# Patient Record
Sex: Male | Born: 1955 | Race: White | Hispanic: No | Marital: Married | Smoking: Former smoker
Health system: Southern US, Community
[De-identification: ages and names within clinical notes are randomized; demographics above are authoritative.]

---

## 2019-12-21 ENCOUNTER — Other Ambulatory Visit: Payer: Self-pay

## 2019-12-21 ENCOUNTER — Inpatient Hospital Stay (HOSPITAL_COMMUNITY)
Admission: AD | Admit: 2019-12-21 | Discharge: 2020-01-03 | DRG: 177 | Disposition: A | Discharge status: Home Health | Payer: HRSA Program | Source: Other Acute Inpatient Hospital | Attending: Internal Medicine | Admitting: Internal Medicine

## 2019-12-21 ENCOUNTER — Encounter (HOSPITAL_COMMUNITY): Payer: Self-pay | Admitting: Student

## 2019-12-21 DIAGNOSIS — Z9981 Dependence on supplemental oxygen: Secondary | ICD-10-CM | POA: Diagnosis not present

## 2019-12-21 DIAGNOSIS — E875 Hyperkalemia: Secondary | ICD-10-CM | POA: Diagnosis not present

## 2019-12-21 DIAGNOSIS — E1169 Type 2 diabetes mellitus with other specified complication: Secondary | ICD-10-CM | POA: Diagnosis not present

## 2019-12-21 DIAGNOSIS — Z79899 Other long term (current) drug therapy: Secondary | ICD-10-CM | POA: Diagnosis not present

## 2019-12-21 DIAGNOSIS — D751 Secondary polycythemia: Secondary | ICD-10-CM | POA: Diagnosis not present

## 2019-12-21 DIAGNOSIS — R0602 Shortness of breath: Secondary | ICD-10-CM

## 2019-12-21 DIAGNOSIS — J1282 Pneumonia due to coronavirus disease 2019: Secondary | ICD-10-CM | POA: Diagnosis present

## 2019-12-21 DIAGNOSIS — U071 COVID-19: Secondary | ICD-10-CM | POA: Diagnosis present

## 2019-12-21 DIAGNOSIS — E1165 Type 2 diabetes mellitus with hyperglycemia: Secondary | ICD-10-CM | POA: Diagnosis present

## 2019-12-21 DIAGNOSIS — T380X5A Adverse effect of glucocorticoids and synthetic analogues, initial encounter: Secondary | ICD-10-CM | POA: Diagnosis present

## 2019-12-21 DIAGNOSIS — R042 Hemoptysis: Secondary | ICD-10-CM | POA: Diagnosis not present

## 2019-12-21 DIAGNOSIS — E860 Dehydration: Secondary | ICD-10-CM | POA: Diagnosis not present

## 2019-12-21 DIAGNOSIS — R0902 Hypoxemia: Secondary | ICD-10-CM | POA: Diagnosis present

## 2019-12-21 DIAGNOSIS — E871 Hypo-osmolality and hyponatremia: Secondary | ICD-10-CM | POA: Diagnosis not present

## 2019-12-21 DIAGNOSIS — Z87891 Personal history of nicotine dependence: Secondary | ICD-10-CM | POA: Diagnosis not present

## 2019-12-21 DIAGNOSIS — Z7984 Long term (current) use of oral hypoglycemic drugs: Secondary | ICD-10-CM

## 2019-12-21 DIAGNOSIS — J9601 Acute respiratory failure with hypoxia: Secondary | ICD-10-CM

## 2019-12-21 DIAGNOSIS — I10 Essential (primary) hypertension: Secondary | ICD-10-CM | POA: Diagnosis present

## 2019-12-21 DIAGNOSIS — J9690 Respiratory failure, unspecified, unspecified whether with hypoxia or hypercapnia: Secondary | ICD-10-CM

## 2019-12-21 DIAGNOSIS — Z7982 Long term (current) use of aspirin: Secondary | ICD-10-CM | POA: Diagnosis not present

## 2019-12-21 MED ORDER — SODIUM CHLORIDE 0.9 % IV SOLN
100.0000 mg | Freq: Every day | INTRAVENOUS | Status: AC
  Administered 2019-12-22 – 2019-12-25 (×4): 100 mg via INTRAVENOUS
  Filled 2019-12-21 (×4): qty 20

## 2019-12-21 MED ORDER — ASPIRIN 81 MG PO CHEW
81.0000 mg | CHEWABLE_TABLET | Freq: Every day | ORAL | Status: DC
  Administered 2019-12-22 – 2019-12-27 (×6): 81 mg via ORAL
  Filled 2019-12-21 (×6): qty 1

## 2019-12-21 MED ORDER — SODIUM CHLORIDE 0.9 % IV SOLN: 100.0000 mg | Freq: Every day | INTRAVENOUS | Status: DC

## 2019-12-21 MED ORDER — HEPARIN SODIUM (PORCINE) 5000 UNIT/ML IJ SOLN: 5000.0000 [IU] | Freq: Three times a day (TID) | INTRAMUSCULAR | Status: DC

## 2019-12-21 MED ORDER — ENOXAPARIN SODIUM 40 MG/0.4ML ~~LOC~~ SOLN
40.0000 mg | Freq: Two times a day (BID) | SUBCUTANEOUS | Status: DC
  Administered 2019-12-22 – 2019-12-27 (×11): 40 mg via SUBCUTANEOUS
  Filled 2019-12-21 (×11): qty 0.4

## 2019-12-21 MED ORDER — SODIUM CHLORIDE 0.9 % IV SOLN: 200.0000 mg | Freq: Once | INTRAVENOUS | Status: DC

## 2019-12-21 MED ORDER — DEXAMETHASONE SODIUM PHOSPHATE 10 MG/ML IJ SOLN
6.0000 mg | INTRAMUSCULAR | Status: DC
  Administered 2019-12-22 – 2019-12-29 (×8): 6 mg via INTRAVENOUS
  Filled 2019-12-21 (×8): qty 1

## 2019-12-21 MED ORDER — ALBUTEROL SULFATE HFA 108 (90 BASE) MCG/ACT IN AERS
2.0000 | INHALATION_SPRAY | Freq: Four times a day (QID) | RESPIRATORY_TRACT | Status: DC
  Administered 2019-12-22 (×2): 2 via RESPIRATORY_TRACT
  Filled 2019-12-21: qty 6.7

## 2019-12-21 MED ORDER — FUROSEMIDE 20 MG PO TABS
20.0000 mg | ORAL_TABLET | Freq: Every day | ORAL | Status: DC
  Administered 2019-12-22 – 2019-12-27 (×6): 20 mg via ORAL
  Filled 2019-12-21 (×6): qty 1

## 2019-12-21 NOTE — H&P (Signed)
NAME:  Theodore Kim, MRN:  [id number], DOB:  10-Aug-1956, LOS: 0 ADMISSION DATE:  12/21/2019, CONSULTATION DATE:  12/21/19 REFERRING MD:  Duke Salvia, CHIEF COMPLAINT:  Covid-19   Brief History   64 y.o. M transferred from [place] with Covid-19 PNA and requiring HFNC.  History of present illness   64 y.o.M with PMH of HTN and Type 2 DM who began experiencing URI symptoms about a week ago and was diagnosed with Covid-19 3/17.  Over the last several days he began experiencing severe shortness of breath with exertion so presented to [facility] ED.    In the ED, he required high flow nasal cannula and had R sided PNA.  His inflammatory markers were significantly elevated and was given Remdesevir, Dexamethasone and Tocilzumab and transferred to [facility] for critical care.  On arrival, pt is awake and conversation without distress and maintaining oxygen sats of >92% on Venti mask.   He states that he is a "mouth breather" and refuses HFNC.  No recent chest pain or LE edema. Pt denies underlying lung disease, but just quit smoking several months ago  Past Medical History  T2DM, HTN  Significant Hospital Events   3/19 Transfer from [place] hospital to [facility]  Consults:    Procedures:    Significant Diagnostic Tests:  3/19 CXR from [place]>>RLL infiltrate  Micro Data:  3/19 Sars-Cov-2>>positive 3/19 BCx2  Antimicrobials:  Remdesevir 3/19-   Interim history/subjective:  Pt arrived from [place] in stable condition on Venti mask  Objective   There were no vitals taken for this visit.   No flowsheet data found.     No intake or output data in the 24 hours ending 12/21/19 2231 There were no vitals filed for this visit.  General:  Well-nourished M in no acute distress HEENT: MM pink/moist Neuro: awake, alert and oriented and conversational CV: s1s2 rrr, no m/r/g PULM:  Good air movement bilaterally with crackles in the bases, no tachypnea, accessory muscle and tolerating  Venti mask  GI: soft, bsx4 active  Extremities: warm/dry, no edema  Skin: no rashes or lesions   Resolved Hospital Problem list     Assessment & Plan:   Covid-19 PNA -Pt refuses HFNC and wants to keep the face mask -currently moving air well without distress P: -try CPAP overnight since he is refusing HFNC -obtain baseline labs and inflammatory markers -Continue Remdesevir and Dexamethasone  -received one dose Tocilizumab, re-evaluate tomorrow if repeat dose indicated  -self-prone as able -hold off anti-biotics for now   HTN -hold home Norvasc and Lisinopril for now in the setting of acute illness -Continue Lasix and Asa    Type 2 DM -hold Metformin -SSI    Best practice:  Diet: regular Pain/Anxiety/Delirium protocol (if indicated): n/a VAP protocol (if indicated): n/a DVT prophylaxis: lovenox GI prophylaxis: n/a Glucose control: SSI Mobility: bed rest Code Status: full code Family Communication: pt able to communicate Disposition:   Labs   CBC: No results for input(s): WBC, NEUTROABS, HGB, HCT, MCV, PLT in the last 168 hours.  Basic Metabolic Panel: No results for input(s): NA, K, CL, CO2, GLUCOSE, BUN, CREATININE, CALCIUM, MG, PHOS in the last 168 hours. GFR: CrCl cannot be calculated (No successful lab value found.). No results for input(s): PROCALCITON, WBC, LATICACIDVEN in the last 168 hours.  Liver Function Tests: No results for input(s): AST, ALT, ALKPHOS, BILITOT, PROT, ALBUMIN in the last 168 hours. No results for input(s): LIPASE, AMYLASE in the last 168 hours. No results for  input(s): AMMONIA in the last 168 hours.  ABG No results found for: PHART, PCO2ART, PO2ART, HCO3, TCO2, ACIDBASEDEF, O2SAT   Coagulation Profile: No results for input(s): INR, PROTIME in the last 168 hours.  Cardiac Enzymes: No results for input(s): CKTOTAL, CKMB, CKMBINDEX, TROPONINI in the last 168 hours.  HbA1C: No results found for: HGBA1C  CBG: No results  for input(s): GLUCAP in the last 168 hours.  Review of Systems:   Negative except as noted in HPI  Past Medical History  DM, HTN  Surgical History     Social History      Family History   His family history is not on file.   Allergies Not on File   Home Medications  Prior to Admission medications   Medication Sig Start Date End Date Taking? Authorizing Provider  amLODipine (NORVASC) 5 MG tablet Take 5 mg by mouth daily.   Yes [provider]  aspirin 81 MG chewable tablet Chew 81 mg by mouth daily.   Yes [provider]  furosemide (LASIX) 20 MG tablet Take 20 mg by mouth daily.   Yes [provider]  lisinopril (ZESTRIL) 20 MG tablet Take 20 mg by mouth daily.   Yes [provider]  metFORMIN (GLUCOPHAGE) 1000 MG tablet Take 1,000 mg by mouth 2 (two) times daily with a meal.   Yes [provider]  metoprolol tartrate (LOPRESSOR) 50 MG tablet Take 50 mg by mouth 2 (two) times daily.   Yes [provider]     Critical care time: 45 minutes      CRITICAL CARE Performed by: Otilio Carpen Miyana Mordecai   Total critical care time: 45 minutes  Critical care time was exclusive of separately billable procedures and treating other patients.  Critical care was necessary to treat or prevent imminent or life-threatening deterioration.  Critical care was time spent personally by me on the following activities: development of treatment plan with patient and/or surrogate as well as nursing, discussions with consultants, evaluation of patient's response to treatment, examination of patient, obtaining history from patient or surrogate, ordering and performing treatments and interventions, ordering and review of laboratory studies, ordering and review of radiographic studies, pulse oximetry and re-evaluation of patient's condition.   Otilio Carpen Jemell Town, PA-C

## 2019-12-21 NOTE — Progress Notes (Signed)
Pt transferred from [facility] - spoke briefly with pharmacist  Pt received: Remdesivir 200mg  IV 3/19 1502 Actemra 800mg  3/19 1623 Dexamethasone 6 mg 3/19 1404 Lovenox 40mg  3/19 1502  Plan: Remdesivir 100mg  daily x 4 days to complete 5 day treatment  4/19, PharmD, BCPS Please see amion for complete clinical pharmacist phone list 12/21/2019 10:44 PM

## 2019-12-22 ENCOUNTER — Inpatient Hospital Stay (HOSPITAL_COMMUNITY): Payer: HRSA Program

## 2019-12-22 DIAGNOSIS — R0602 Shortness of breath: Secondary | ICD-10-CM

## 2019-12-22 LAB — BRAIN NATRIURETIC PEPTIDE: B Natriuretic Peptide: 67.1 pg/mL (ref 0.0–100.0)

## 2019-12-22 LAB — GLUCOSE, CAPILLARY
Glucose-Capillary: 327 mg/dL — ABNORMAL HIGH (ref 70–99)
Glucose-Capillary: 210 mg/dL — ABNORMAL HIGH (ref 70–99)
Glucose-Capillary: 223 mg/dL — ABNORMAL HIGH (ref 70–99)

## 2019-12-22 LAB — FIBRINOGEN: Fibrinogen: 772 mg/dL — ABNORMAL HIGH (ref 210–475)

## 2019-12-22 LAB — ABO/RH: ABO/RH(D): A POS

## 2019-12-22 LAB — CBC
HCT: 42.7 % (ref 39.0–52.0)
Hemoglobin: 14.1 g/dL (ref 13.0–17.0)
MCH: 30.1 pg (ref 26.0–34.0)
MCHC: 33 g/dL (ref 30.0–36.0)
MCV: 91 fL (ref 80.0–100.0)
Platelets: 289 10*3/uL (ref 150–400)
RBC: 4.69 MIL/uL (ref 4.22–5.81)
RDW: 12.5 % (ref 11.5–15.5)
WBC: 6.9 10*3/uL (ref 4.0–10.5)
nRBC: 0 % (ref 0.0–0.2)

## 2019-12-22 LAB — POCT I-STAT 7, (LYTES, BLD GAS, ICA,H+H)
Acid-base deficit: 2 mmol/L (ref 0.0–2.0)
Bicarbonate: 22.3 mmol/L (ref 20.0–28.0)
Calcium, Ion: 1.2 mmol/L (ref 1.15–1.40)
HCT: 40 % (ref 39.0–52.0)
Hemoglobin: 13.6 g/dL (ref 13.0–17.0)
O2 Saturation: 94 %
Patient temperature: 97.8
Potassium: 4.6 mmol/L (ref 3.5–5.1)
Sodium: 133 mmol/L — ABNORMAL LOW (ref 135–145)
TCO2: 23 mmol/L (ref 22–32)
pCO2 arterial: 34.8 mmHg (ref 32.0–48.0)
pH, Arterial: 7.413 (ref 7.350–7.450)
pO2, Arterial: 67 mmHg — ABNORMAL LOW (ref 83.0–108.0)

## 2019-12-22 LAB — COMPREHENSIVE METABOLIC PANEL
ALT: 39 U/L (ref 0–44)
AST: 37 U/L (ref 15–41)
Albumin: 2.2 g/dL — ABNORMAL LOW (ref 3.5–5.0)
Alkaline Phosphatase: 133 U/L — ABNORMAL HIGH (ref 38–126)
Anion gap: 10 (ref 5–15)
BUN: 32 mg/dL — ABNORMAL HIGH (ref 8–23)
CO2: 21 mmol/L — ABNORMAL LOW (ref 22–32)
Calcium: 8.3 mg/dL — ABNORMAL LOW (ref 8.9–10.3)
Chloride: 101 mmol/L (ref 98–111)
Creatinine, Ser: 1.28 mg/dL — ABNORMAL HIGH (ref 0.61–1.24)
GFR calc Af Amer: >60 mL/min (ref >60)
GFR calc non Af Amer: 59 mL/min — ABNORMAL LOW (ref >60)
Glucose, Bld: 373 mg/dL — ABNORMAL HIGH (ref 70–99)
Potassium: 4.9 mmol/L (ref 3.5–5.1)
Sodium: 132 mmol/L — ABNORMAL LOW (ref 135–145)
Total Bilirubin: 1.5 mg/dL — ABNORMAL HIGH (ref 0.3–1.2)
Total Protein: 7.3 g/dL (ref 6.5–8.1)

## 2019-12-22 LAB — FERRITIN: Ferritin: 1421 ng/mL — ABNORMAL HIGH (ref 24–336)

## 2019-12-22 LAB — MAGNESIUM: Magnesium: 2 mg/dL (ref 1.7–2.4)

## 2019-12-22 LAB — LACTATE DEHYDROGENASE: LDH: 604 U/L — ABNORMAL HIGH (ref 98–192)

## 2019-12-22 LAB — HEMOGLOBIN A1C
Hgb A1c MFr Bld: 8.3 % — ABNORMAL HIGH (ref 4.8–5.6)
Mean Plasma Glucose: 191.51 mg/dL

## 2019-12-22 LAB — PHOSPHORUS: Phosphorus: 3.3 mg/dL (ref 2.5–4.6)

## 2019-12-22 LAB — C-REACTIVE PROTEIN: CRP: 25.6 mg/dL — ABNORMAL HIGH (ref <1.0)

## 2019-12-22 LAB — D-DIMER, QUANTITATIVE: D-Dimer, Quant: 2.36 ug/mL-FEU — ABNORMAL HIGH (ref 0.00–0.50)

## 2019-12-22 LAB — HIV ANTIBODY (ROUTINE TESTING W REFLEX): HIV Screen 4th Generation wRfx: NONREACTIVE

## 2019-12-22 MED ORDER — INSULIN ASPART 100 UNIT/ML ~~LOC~~ SOLN
0.0000 [IU] | Freq: Every day | SUBCUTANEOUS | Status: DC
  Administered 2019-12-22: 21:00:00 2 [IU] via SUBCUTANEOUS
  Administered 2019-12-23: 23:00:00 3 [IU] via SUBCUTANEOUS

## 2019-12-22 MED ORDER — ACETAMINOPHEN 325 MG PO TABS: 650.0000 mg | ORAL_TABLET | Freq: Four times a day (QID) | ORAL | Status: DC | PRN

## 2019-12-22 MED ORDER — ALBUTEROL SULFATE HFA 108 (90 BASE) MCG/ACT IN AERS
2.0000 | INHALATION_SPRAY | RESPIRATORY_TRACT | Status: DC | PRN
  Administered 2019-12-24 – 2019-12-30 (×3): 2 via RESPIRATORY_TRACT
  Filled 2019-12-22: qty 6.7

## 2019-12-22 MED ORDER — PERFLUTREN LIPID MICROSPHERE
1.0000 mL | INTRAVENOUS | Status: AC | PRN
  Administered 2019-12-22: 2 mL via INTRAVENOUS
  Filled 2019-12-22: qty 10

## 2019-12-22 MED ORDER — INSULIN ASPART 100 UNIT/ML ~~LOC~~ SOLN
0.0000 [IU] | Freq: Three times a day (TID) | SUBCUTANEOUS | Status: DC
  Administered 2019-12-22: 7 [IU] via SUBCUTANEOUS
  Administered 2019-12-22: 15 [IU] via SUBCUTANEOUS
  Administered 2019-12-23: 17:00:00 3 [IU] via SUBCUTANEOUS
  Administered 2019-12-23: 12:00:00 7 [IU] via SUBCUTANEOUS
  Administered 2019-12-23: 08:00:00 11 [IU] via SUBCUTANEOUS
  Administered 2019-12-24: 09:00:00 7 [IU] via SUBCUTANEOUS
  Administered 2019-12-24 – 2019-12-25 (×3): 4 [IU] via SUBCUTANEOUS
  Administered 2019-12-25: 12:00:00 3 [IU] via SUBCUTANEOUS
  Administered 2019-12-25 – 2019-12-27 (×7): 4 [IU] via SUBCUTANEOUS
  Administered 2019-12-28: 18:00:00 3 [IU] via SUBCUTANEOUS
  Administered 2019-12-28: 13:00:00 7 [IU] via SUBCUTANEOUS
  Administered 2019-12-28: 09:00:00 4 [IU] via SUBCUTANEOUS
  Administered 2019-12-29: 18:00:00 3 [IU] via SUBCUTANEOUS
  Administered 2019-12-29 (×2): 4 [IU] via SUBCUTANEOUS
  Administered 2019-12-30: 17:00:00 11 [IU] via SUBCUTANEOUS
  Administered 2019-12-30: 09:00:00 7 [IU] via SUBCUTANEOUS
  Administered 2019-12-30: 13:00:00 11 [IU] via SUBCUTANEOUS
  Administered 2019-12-31: 13:00:00 4 [IU] via SUBCUTANEOUS
  Administered 2019-12-31: 17:00:00 11 [IU] via SUBCUTANEOUS
  Administered 2019-12-31: 09:00:00 4 [IU] via SUBCUTANEOUS
  Administered 2020-01-01: 08:00:00 3 [IU] via SUBCUTANEOUS
  Administered 2020-01-01: 7 [IU] via SUBCUTANEOUS
  Administered 2020-01-02: 17:00:00 4 [IU] via SUBCUTANEOUS
  Administered 2020-01-02: 08:00:00 3 [IU] via SUBCUTANEOUS
  Administered 2020-01-02: 12:00:00 4 [IU] via SUBCUTANEOUS

## 2019-12-22 MED ORDER — CHLORHEXIDINE GLUCONATE CLOTH 2 % EX PADS
6.0000 | MEDICATED_PAD | Freq: Every day | CUTANEOUS | Status: DC
  Administered 2019-12-22: 21:00:00 6 via TOPICAL

## 2019-12-22 NOTE — Plan of Care (Signed)
  Problem: Education: Goal: Knowledge of General Education information will improve Description: Including pain rating scale, medication(s)/side effects and non-pharmacologic comfort measures Outcome: Progressing   Problem: Activity: Goal: Risk for activity intolerance will decrease Outcome: Progressing   Problem: Nutrition: Goal: Adequate nutrition will be maintained Outcome: Progressing   Problem: Coping: Goal: Level of anxiety will decrease Outcome: Progressing   Problem: Pain Managment: Goal: General experience of comfort will improve Outcome: Progressing   Problem: Safety: Goal: Ability to remain free from injury will improve Outcome: Progressing   Problem: Respiratory: Goal: Will maintain a patent airway Outcome: Progressing

## 2019-12-22 NOTE — Progress Notes (Signed)
Pt's wife called twice today for updates. As the pt is a new admission, we discussed lack of visitation, video visits, and agreed that she would act as the point of contact for the pt.  This was reinforced with pt's granddaughter who called later to set up a video visit; she expressed understanding that the pt's wife should be the only person calling the unit directly, and any other persons should contact the pt or his wife directly.  Pt's wife also had questions about today's chest xray, course of treatment, and the course of covid PNA. These were all discussed in detail, and she expressed understanding.   Pt is very pleasant and cooperative, but was hesitant to get up to the chair today, despite encouragement. I will continue to encourage bed-to-chair and sit-to-stand mobility.   As he struggled managing NRB mask while eating, we attempted the HFNC for dinner. While the cannula allowed him to eat more comfortably, he was unable to maintain spO2>85%, so the NRB was replaced after eating.

## 2019-12-22 NOTE — Progress Notes (Addendum)
  Echocardiogram 2D Echocardiogram with contrast has been performed.  Theodore Kim F 12/22/2019, 5:53 PM

## 2019-12-22 NOTE — Progress Notes (Signed)
NAME:  Theodore Kim, MRN:  [id number], DOB:  [birth year], LOS: 1 ADMISSION DATE:  12/21/2019, CONSULTATION DATE:  12/22/19 REFERRING MD:  Oval Linsey, CHIEF COMPLAINT: Short of breath  Brief History   64 yo male had cough, fever, short of breath x one week.  Found to have COVID 19 on 3/17.  Developed worsening dyspnea and presented to [place] on 3/19.  Found to have COVID 19 pneumonia started on remdesivir, dexamethasone, and tocilizumab.  Transferred to [facility] for further management.  Past Medical History  HTN, DM type II  Significant Hospital Events   3/19 Transfer from [place] [facility] to [facility]  Consults:    Procedures:    Significant Diagnostic Tests:    Micro Data:  3/19 Sars-Cov-2 >> positive 3/19 Blood >>   Antimicrobials:  Tocilizumab 3/19 [facility] hospital) Dexamethasone 3/19 >> Remdesivir 3/19 >>   Interim history/subjective:  Remains on NRB.  Objective   Blood pressure 125/85, pulse 78, temperature 97.6 F (36.4 C), temperature source Axillary, resp. rate (!) 24, height 5\' 10"  (1.778 m), weight 96.4 kg, SpO2 90 %.    Vitals with BMI 12/22/2019 12/22/2019 12/22/2019  Height - - -  Weight - - -  BMI - - -  Systolic - 465 035  Diastolic - 85 75  Pulse 78 82 89        Intake/Output Summary (Last 24 hours) at 12/22/2019 1024 Last data filed at 12/22/2019 4656 Gross per 24 hour  Intake --  Output 525 ml  Net -525 ml   Filed Weights   12/21/19 2321 12/22/19 0310  Weight: 96.4 kg 96.4 kg     General - alert Eyes - pupils reactive ENT - no sinus tenderness, no stridor Cardiac - regular rate/rhythm, no murmur Chest - scattered rhonchi Abdomen - soft, non tender, + bowel sounds Extremities - no cyanosis, clubbing, or edema Skin - no rashes Neuro - normal strength, moves extremities, follows commands Psych - normal mood and behavior   Resolved Hospital Problem list     Assessment & Plan:   Acute hypoxic respiratory failure from COVID 19  pneumonia. - received tocilizumab on 3/19 at [facility] - day 2/5 of remdesivir - day 2/10 of dexamethasone - prone positioning as tolerated - bronchial hygiene - f/u CXR intermittently - goal SpO2 85 to 95%  Hx of HTN. - continue ASA, lasix - hold outpt norvasc, lisinopril, lopressor for now  DM type II poorly controlled with steroid induced hyperglycemia. - SSI - hold outpt metformin   Best practice:  Diet: Carb modified, heart healthy DVT prophylaxis: lovenox GI prophylaxis: n/a Mobility: oob to chair Code Status: full code Disposition: ICU  Labs    CMP Latest Ref Rng & Units 12/22/2019 12/21/2019  Glucose 70 - 99 mg/dL - 373(H)  BUN 8 - 23 mg/dL - 32(H)  Creatinine 0.61 - 1.24 mg/dL - 1.28(H)  Sodium 135 - 145 mmol/L 133(L) 132(L)  Potassium 3.5 - 5.1 mmol/L 4.6 4.9  Chloride 98 - 111 mmol/L - 101  CO2 22 - 32 mmol/L - 21(L)  Calcium 8.9 - 10.3 mg/dL - 8.3(L)  Total Protein 6.5 - 8.1 g/dL - 7.3  Total Bilirubin 0.3 - 1.2 mg/dL - 1.5(H)  Alkaline Phos 38 - 126 U/L - 133(H)  AST 15 - 41 U/L - 37  ALT 0 - 44 U/L - 39    CBC Latest Ref Rng & Units 12/22/2019 12/21/2019  WBC 4.0 - 10.5 K/uL - 6.9  Hemoglobin 13.0 - 17.0 g/dL  13.6 14.1  Hematocrit 39.0 - 52.0 % 40.0 42.7  Platelets 150 - 400 K/uL - 289    ABG    Component Value Date/Time   PHART 7.413 12/22/2019 0358   PCO2ART 34.8 12/22/2019 0358   PO2ART 67.0 (L) 12/22/2019 0358   HCO3 22.3 12/22/2019 0358   TCO2 23 12/22/2019 0358   ACIDBASEDEF 2.0 12/22/2019 0358   O2SAT 94.0 12/22/2019 0358    CBG (last 3)  No results for input(s): GLUCAP in the last 72 hours.   CC time 33 minutes  Coralyn Helling, MD [facility] Pulmonary/Critical Care 12/22/2019, 10:31 AM

## 2019-12-22 NOTE — Progress Notes (Signed)
eLink Physician-Brief Progress Note Patient Name: Theodore Kim DOB: 12/10/1955 MRN: [id number]   Date of Service  12/22/2019  HPI/Events of Note  Pt transferred from outside hospital with acute respiratory failure from Covid-19 pneumonia  eICU Interventions  New Patient Evaluation completed.        [name] U Lenard Kampf 12/22/2019, 1:32 AM

## 2019-12-23 ENCOUNTER — Inpatient Hospital Stay (HOSPITAL_COMMUNITY): Payer: HRSA Program

## 2019-12-23 DIAGNOSIS — U071 COVID-19: Principal | ICD-10-CM

## 2019-12-23 DIAGNOSIS — J1282 Pneumonia due to coronavirus disease 2019: Secondary | ICD-10-CM

## 2019-12-23 LAB — CBC
HCT: 43.7 % (ref 39.0–52.0)
Hemoglobin: 14.8 g/dL (ref 13.0–17.0)
MCH: 30.5 pg (ref 26.0–34.0)
MCHC: 33.9 g/dL (ref 30.0–36.0)
MCV: 89.9 fL (ref 80.0–100.0)
Platelets: 507 10*3/uL — ABNORMAL HIGH (ref 150–400)
RBC: 4.86 MIL/uL (ref 4.22–5.81)
RDW: 12.3 % (ref 11.5–15.5)
WBC: 14 10*3/uL — ABNORMAL HIGH (ref 4.0–10.5)
nRBC: 0.3 % — ABNORMAL HIGH (ref 0.0–0.2)

## 2019-12-23 LAB — COMPREHENSIVE METABOLIC PANEL
ALT: 38 U/L (ref 0–44)
AST: 28 U/L (ref 15–41)
Albumin: 2.2 g/dL — ABNORMAL LOW (ref 3.5–5.0)
Alkaline Phosphatase: 144 U/L — ABNORMAL HIGH (ref 38–126)
Anion gap: 10 (ref 5–15)
BUN: 33 mg/dL — ABNORMAL HIGH (ref 8–23)
CO2: 22 mmol/L (ref 22–32)
Calcium: 8.7 mg/dL — ABNORMAL LOW (ref 8.9–10.3)
Chloride: 103 mmol/L (ref 98–111)
Creatinine, Ser: 1 mg/dL (ref 0.61–1.24)
GFR calc Af Amer: >60 mL/min (ref >60)
GFR calc non Af Amer: >60 mL/min (ref >60)
Glucose, Bld: 290 mg/dL — ABNORMAL HIGH (ref 70–99)
Potassium: 4.9 mmol/L (ref 3.5–5.1)
Sodium: 135 mmol/L (ref 135–145)
Total Bilirubin: 1.2 mg/dL (ref 0.3–1.2)
Total Protein: 7.1 g/dL (ref 6.5–8.1)

## 2019-12-23 LAB — C-REACTIVE PROTEIN: CRP: 14.3 mg/dL — ABNORMAL HIGH (ref <1.0)

## 2019-12-23 LAB — ECHOCARDIOGRAM COMPLETE
Height: 70 in
Weight: 3400.38 oz

## 2019-12-23 LAB — GLUCOSE, CAPILLARY
Glucose-Capillary: 284 mg/dL — ABNORMAL HIGH (ref 70–99)
Glucose-Capillary: 212 mg/dL — ABNORMAL HIGH (ref 70–99)
Glucose-Capillary: 146 mg/dL — ABNORMAL HIGH (ref 70–99)
Glucose-Capillary: 263 mg/dL — ABNORMAL HIGH (ref 70–99)

## 2019-12-23 MED ORDER — INSULIN GLARGINE 100 UNIT/ML ~~LOC~~ SOLN
10.0000 [IU] | Freq: Every day | SUBCUTANEOUS | Status: DC
  Administered 2019-12-23 – 2019-12-24 (×2): 10 [IU] via SUBCUTANEOUS
  Filled 2019-12-23 (×2): qty 0.1

## 2019-12-23 MED ORDER — ORAL CARE MOUTH RINSE
15.0000 mL | Freq: Two times a day (BID) | OROMUCOSAL | Status: DC
  Administered 2019-12-24 – 2020-01-03 (×17): 15 mL via OROMUCOSAL

## 2019-12-23 MED ORDER — SALINE SPRAY 0.65 % NA SOLN
2.0000 | Freq: Two times a day (BID) | NASAL | Status: DC
  Administered 2019-12-23 – 2020-01-03 (×22): 2 via NASAL
  Filled 2019-12-23: qty 44

## 2019-12-23 MED ORDER — AMLODIPINE BESYLATE 5 MG PO TABS
5.0000 mg | ORAL_TABLET | Freq: Every day | ORAL | Status: DC
  Administered 2019-12-23 – 2019-12-28 (×6): 5 mg via ORAL
  Filled 2019-12-23 (×6): qty 1

## 2019-12-23 MED ORDER — METFORMIN HCL 500 MG PO TABS
1000.0000 mg | ORAL_TABLET | Freq: Two times a day (BID) | ORAL | Status: DC
  Administered 2019-12-23 – 2019-12-26 (×7): 1000 mg via ORAL
  Filled 2019-12-23 (×7): qty 2

## 2019-12-23 NOTE — Progress Notes (Signed)
Patient educated about flutter valve and incentive spirometer. Both at bedside. Patient dyspneic with mild exertion and unable to tolerate being off non-rebreather. Will attempt later.   Lyndal Pulley, RN 12/23/2019 4:47 PM

## 2019-12-23 NOTE — Progress Notes (Signed)
Theodore Kim [id number] Admission Data: 12/23/2019 4:45 PM Attending Provider: Coralyn Helling, MD  PCP:No primary care provider on file.  Theodore Kim is a 64 y.o. male patient transferred from 40M to 5W-10 awake, alert  & orientated  X 3,  Full Code, VSS - Blood pressure (!) 140/93, pulse 87, temperature 97.9 F (36.6 C), temperature source Axillary, resp. rate (!) 33, height 5\' 10"  (1.778 m), weight 88.6 kg, SpO2 90 %.on non-rebreather. Tele # [id number] placed and pt is currently running:normal sinus rhythm.  Pt orientation to unit, room and routine. Information packet given to patient/family.  Admission INP armband ID verified with patient/family, and in place. SR up x 2, fall risk assessment complete with patient and family verbalizing understanding of risks associated with falls. Pt verbalizes an understanding of how to use the call bell and to call for help before getting out of bed.  Skin, clean-dry- intact without evidence of bruising, or skin tears.      Will continue to monitor and assist as needed.  , RN 12/23/2019 4:45 PM

## 2019-12-23 NOTE — Progress Notes (Signed)
Called and updated pt's wife, who was very appreciative of the healthcare team's time and efforts. Updates included home meds added, lab markers trending in a positive direction, stable CXR, and pending tx to PCU.

## 2019-12-23 NOTE — Progress Notes (Signed)
NAME:  Theodore Kim, MRN:  [id number], DOB:  [birth year], LOS: 2 ADMISSION DATE:  12/21/2019, CONSULTATION DATE:  12/23/19 REFERRING MD:  Duke Salvia, CHIEF COMPLAINT: Short of breath  Brief History   64 yo male had cough, fever, short of breath x one week.  Found to have COVID 19 on 3/17.  Developed worsening dyspnea and presented to [place] on 3/19.  Found to have COVID 19 pneumonia started on remdesivir, dexamethasone, and tocilizumab.  Transferred to [facility] for further management.  Past Medical History  HTN, DM type II  Significant Hospital Events   3/19 Transfer from [place] [facility] to [facility] 3/21 Transfer to progressive care  Consults:    Procedures:    Significant Diagnostic Tests:    Micro Data:  3/19 Sars-Cov-2 >> positive 3/19 Blood >>   Antimicrobials:  Tocilizumab 3/19 [facility] hospital) Dexamethasone 3/19 >> Remdesivir 3/19 >>   Interim history/subjective:  Didn't like using [place].  Feels like he is getting more air into his lungs.  Objective   Blood pressure (!) 141/87, pulse 82, temperature 97.8 F (36.6 C), temperature source Axillary, resp. rate (!) 34, height 5\' 10"  (1.778 m), weight 96.4 kg, SpO2 96 %.    Vitals with BMI 12/23/2019 12/23/2019 12/23/2019  Height - - -  Weight - - -  BMI - - -  Systolic 141 134 12/25/2019  Diastolic 87 83 84  Pulse 82 79 75        Intake/Output Summary (Last 24 hours) at 12/23/2019 12/25/2019 Last data filed at 12/23/2019 0500 Gross per 24 hour  Intake 100 ml  Output 1975 ml  Net -1875 ml   Filed Weights   12/21/19 2321 12/22/19 0310  Weight: 96.4 kg 96.4 kg     General - alert Eyes - pupils reactive ENT - no sinus tenderness, no stridor Cardiac - regular rate/rhythm, no murmur Chest - coarse BS b/l, no wheeze Abdomen - soft, non tender, + bowel sounds Extremities - no cyanosis, clubbing, or edema Skin - no rashes Neuro - normal strength, moves extremities, follows commands Psych - normal mood and behavior    Resolved Hospital Problem list     Assessment & Plan:   Acute hypoxic respiratory failure from COVID 19 pneumonia. - received tocilizumab on 3/19 at [facility] - day 3/5 of remdesivir - day 3/10 of dexamethasone - prone positioning as able - bronchial hygiene - unable to tolerate using heated high flow oxygen - f/u CXR intermittently - goal SpO2 85 to 95%  Hx of HTN. - continue ASA, lasix - resume norvasc - hold outpt lisinopril, lopressor for now - f/u Echo  DM type II poorly controlled with steroid induced hyperglycemia. - SSI - resume metformin   Best practice:  Diet: Carb modified, heart healthy DVT prophylaxis: lovenox GI prophylaxis: n/a Mobility: oob to chair Code Status: full code Disposition: progressive care 3/21 >> To [place] 3/22 and PCCM off.  Labs    CMP Latest Ref Rng & Units 12/23/2019 12/22/2019 12/21/2019  Glucose 70 - 99 mg/dL 12/23/2019) - 761(Y)  BUN 8 - 23 mg/dL 073(X) - 10(G)  Creatinine 0.61 - 1.24 mg/dL 26(R - 4.85)  Sodium 135 - 145 mmol/L 135 133(L) 132(L)  Potassium 3.5 - 5.1 mmol/L 4.9 4.6 4.9  Chloride 98 - 111 mmol/L 103 - 101  CO2 22 - 32 mmol/L 22 - 21(L)  Calcium 8.9 - 10.3 mg/dL 4.62(V) - 8.3(L)  Total Protein 6.5 - 8.1 g/dL 7.1 - 7.3  Total Bilirubin 0.3 -  1.2 mg/dL 1.2 - 1.5(H)  Alkaline Phos 38 - 126 U/L 144(H) - 133(H)  AST 15 - 41 U/L 28 - 37  ALT 0 - 44 U/L 38 - 39    CBC Latest Ref Rng & Units 12/23/2019 12/22/2019 12/21/2019  WBC 4.0 - 10.5 K/uL 14.0(H) - 6.9  Hemoglobin 13.0 - 17.0 g/dL 14.8 13.6 14.1  Hematocrit 39.0 - 52.0 % 43.7 40.0 42.7  Platelets 150 - 400 K/uL 507(H) - 289    ABG    Component Value Date/Time   PHART 7.413 12/22/2019 0358   PCO2ART 34.8 12/22/2019 0358   PO2ART 67.0 (L) 12/22/2019 0358   HCO3 22.3 12/22/2019 0358   TCO2 23 12/22/2019 0358   ACIDBASEDEF 2.0 12/22/2019 0358   O2SAT 94.0 12/22/2019 0358    CBG (last 3)  Recent Labs    12/22/19 1613 12/22/19 2053 12/23/19 0737  GLUCAP 210*  223* 284*    CXR (reviewed by me) - no change in b/l ASD  Chesley Mires, MD [facility] Pulmonary/Critical Care 12/23/2019, 8:33 AM

## 2019-12-24 ENCOUNTER — Inpatient Hospital Stay (HOSPITAL_COMMUNITY): Payer: HRSA Program

## 2019-12-24 DIAGNOSIS — E1169 Type 2 diabetes mellitus with other specified complication: Secondary | ICD-10-CM

## 2019-12-24 DIAGNOSIS — I10 Essential (primary) hypertension: Secondary | ICD-10-CM

## 2019-12-24 LAB — BASIC METABOLIC PANEL
Anion gap: 15 (ref 5–15)
BUN: 36 mg/dL — ABNORMAL HIGH (ref 8–23)
CO2: 24 mmol/L (ref 22–32)
Calcium: 9 mg/dL (ref 8.9–10.3)
Chloride: 98 mmol/L (ref 98–111)
Creatinine, Ser: 1.03 mg/dL (ref 0.61–1.24)
GFR calc Af Amer: >60 mL/min (ref >60)
GFR calc non Af Amer: >60 mL/min (ref >60)
Glucose, Bld: 235 mg/dL — ABNORMAL HIGH (ref 70–99)
Potassium: 4.7 mmol/L (ref 3.5–5.1)
Sodium: 137 mmol/L (ref 135–145)

## 2019-12-24 LAB — FERRITIN: Ferritin: 1082 ng/mL — ABNORMAL HIGH (ref 24–336)

## 2019-12-24 LAB — CBC
HCT: 47.5 % (ref 39.0–52.0)
Hemoglobin: 15.6 g/dL (ref 13.0–17.0)
MCH: 30.2 pg (ref 26.0–34.0)
MCHC: 32.8 g/dL (ref 30.0–36.0)
MCV: 91.9 fL (ref 80.0–100.0)
Platelets: 634 10*3/uL — ABNORMAL HIGH (ref 150–400)
RBC: 5.17 MIL/uL (ref 4.22–5.81)
RDW: 12.5 % (ref 11.5–15.5)
WBC: 12.1 10*3/uL — ABNORMAL HIGH (ref 4.0–10.5)
nRBC: 0.2 % (ref 0.0–0.2)

## 2019-12-24 LAB — GLUCOSE, CAPILLARY
Glucose-Capillary: 204 mg/dL — ABNORMAL HIGH (ref 70–99)
Glucose-Capillary: 176 mg/dL — ABNORMAL HIGH (ref 70–99)
Glucose-Capillary: 158 mg/dL — ABNORMAL HIGH (ref 70–99)
Glucose-Capillary: 165 mg/dL — ABNORMAL HIGH (ref 70–99)

## 2019-12-24 LAB — C-REACTIVE PROTEIN: CRP: 5.6 mg/dL — ABNORMAL HIGH (ref <1.0)

## 2019-12-24 LAB — D-DIMER, QUANTITATIVE: D-Dimer, Quant: 2.03 ug/mL-FEU — ABNORMAL HIGH (ref 0.00–0.50)

## 2019-12-24 MED ORDER — INSULIN ASPART 100 UNIT/ML ~~LOC~~ SOLN
4.0000 [IU] | Freq: Three times a day (TID) | SUBCUTANEOUS | Status: AC
  Administered 2019-12-24 – 2019-12-30 (×18): 4 [IU] via SUBCUTANEOUS

## 2019-12-24 MED ORDER — INSULIN GLARGINE 100 UNIT/ML ~~LOC~~ SOLN
14.0000 [IU] | Freq: Every day | SUBCUTANEOUS | Status: DC
  Administered 2019-12-25 – 2020-01-03 (×10): 14 [IU] via SUBCUTANEOUS
  Filled 2019-12-24 (×11): qty 0.14

## 2019-12-24 MED ORDER — LORAZEPAM 2 MG/ML IJ SOLN
1.0000 mg | Freq: Once | INTRAMUSCULAR | Status: AC
  Administered 2019-12-24: 1 mg via INTRAVENOUS
  Filled 2019-12-24: qty 1

## 2019-12-24 NOTE — Progress Notes (Signed)
Patient up from bed in the chair, from  NRB mask to venturi mask at 10L oxygen saturation 93-94% in the chair. Pt educated on deep breathing and  Inceptive spirometry. Pt was not able to tolerate HFNC will attempt to decreased oxygen concentration.

## 2019-12-24 NOTE — Progress Notes (Signed)
Inpatient Diabetes Program Recommendations  AACE/ADA: New Consensus Statement on Inpatient Glycemic Control (2015)  Target Ranges:  Prepandial:   less than 140 mg/dL      Peak postprandial:   less than 180 mg/dL (1-2 hours)      Critically ill patients:  140 - 180 mg/dL   Lab Results  Component Value Date   GLUCAP 204 (H) 12/24/2019   HGBA1C 8.3 (H) 12/21/2019    Review of Glycemic Control Results for Theodore Kim, Theodore Kim (MRN [id number]) as of 12/24/2019 10:28  Ref. Range 12/23/2019 07:37 12/23/2019 11:29 12/23/2019 15:51 12/23/2019 21:18 12/24/2019 08:18  Glucose-Capillary Latest Ref Range: 70 - 99 mg/dL 409 (H) 811 (H) 914 (H) 263 (H) 204 (H)   Diabetes history: DM 2 Outpatient Diabetes medications: Amaryl 2 mg Daily, Metformin 1000 mg bid Current orders for Inpatient glycemic control:  Lantus 10 units Daily Novolog 0-20 units tid + hs Metformin 1000 mg bid  A1c 8.3% on 3/19 Decadron 6 mg Q24 hours BUN/Creat: 36/1.03  Inpatient Diabetes Program Recommendations:    Consider increasing Lantus to 15 units.  Thanks,  Christena Deem RN, MSN, BC-ADM Inpatient Diabetes Coordinator Team Pager [phone] (8a-5p)

## 2019-12-24 NOTE — Progress Notes (Signed)
PROGRESS NOTE                                                                                                                                                                                                             Patient Demographics:    Theodore Kim, is a 64 y.o. male, DOB - January 19, 1956, ZLD:[id number]  Outpatient Primary MD for the patient is No primary care provider on file.   Admit date - 12/21/2019   LOS - 3  No chief complaint on file.      Brief Narrative: Patient is a 64 y.o. male with PMHx of DM-2, HTN who was diagnosed with COVID-19 on 3/17-transferred from [facility] to [facility] ICU for worsening hypoxemia.  He was managed in the ICU-and upon stability transfer to the [facility].  See below for further details.  Significant Events: 3/19>> admit to [facility], ICU for worsening hypoxemia. 3/20>> TTE EF 55-60% 3/22>> transferred to [facility]  COVID-19 medications: Remdesivir: 3/19>> Steroids: 3/19>> Actemra: 3/19 x 1  Microbiology data: 3/19: Blood culture: Negative  DVT prophylaxis: Prophylactic Lovenox  Procedures: None  Consults: PCCM   Subjective:    Theodore Kim today was lying comfortably in bed-he was on 100% NRB-he does not like using nasal cannula as he is a "mouth breather"   Assessment  & Plan :   Acute Hypoxic Resp Failure due to Covid 19 Viral pneumonia: Continues to have severe hypoxia but appears very comfortable.  Continue steroids/remdesivir-follow clinical trajectory and inflammatory markers.  Encourage ambulation/incentive spirometry/flutter valve.  RN instructed to titrate down FiO2 as tolerated  Fever: afebrile  O2 requirements:  SpO2: 97 % O2 Flow Rate (L/min): 15 L/min   COVID-19 Labs: Recent Labs    12/21/19 2352 12/23/19 0359 12/24/19 0309 12/24/19 0735  DDIMER 2.36*  --  2.03*  --   FERRITIN 1,421*  --   --  1,082*  LDH 604*  --   --   --    CRP 25.6* 14.3*  --  5.6*       Component Value Date/Time   BNP 67.1 12/21/2019 2352    No results for input(s): PROCALCITON in the last 168 hours.  No results found for: SARSCOV2NAA   Prone/Incentive Spirometry: encouraged incentive spirometry/flutter use 3-4/hour.  DM-2 (A1c 8.3 on 12/21/2019) with mild hyperglycemia secondary to steroids:  CBGs remain mildly elevated-increase Lantus to 14 units, add 4 units of NovoLog with meals.  Continue SSI and follow/optimize  Recent Labs    12/23/19 1551 12/23/19 2118 12/24/19 0818  GLUCAP 146* 263* 204*   HTN: BP controlled-continue amlodipine  ABG:    Component Value Date/Time   PHART 7.413 12/22/2019 0358   PCO2ART 34.8 12/22/2019 0358   PO2ART 67.0 (L) 12/22/2019 0358   HCO3 22.3 12/22/2019 0358   TCO2 23 12/22/2019 0358   ACIDBASEDEF 2.0 12/22/2019 0358   O2SAT 94.0 12/22/2019 0358    Vent Settings: N/A  Condition - Extremely Guarded  Family Communication  :  Spouse updated over the phone 3/22  Code Status :  Full Code  Diet :  Diet Order            Diet heart healthy/carb modified Room service appropriate? Yes; Fluid consistency: Thin  Diet effective now               Disposition Plan  : Probably home with home health services or SNF when ready for discharge.  Barriers to discharge: Hypoxia requiring O2 supplementation/complete 5 days of IV Remdesivir  Antimicorbials  :    Anti-infectives (From admission, onward)   Start     Dose/Rate Route Frequency Ordered Stop   12/23/19 1000  remdesivir 100 mg in sodium chloride 0.9 % 100 mL IVPB  Status:  Discontinued     100 mg 200 mL/hr over 30 Minutes Intravenous Daily 12/21/19 2231 12/21/19 2246   12/22/19 1000  remdesivir 100 mg in sodium chloride 0.9 % 100 mL IVPB     100 mg 200 mL/hr over 30 Minutes Intravenous Daily 12/21/19 2248 12/26/19 0959   12/22/19 0900  remdesivir 200 mg in sodium chloride 0.9% 250 mL IVPB  Status:  Discontinued     200 mg 580  mL/hr over 30 Minutes Intravenous Once 12/21/19 2231 12/21/19 2246      Inpatient Medications  Scheduled Meds:  amLODipine  5 mg Oral Daily   aspirin  81 mg Oral Daily   dexamethasone (DECADRON) injection  6 mg Intravenous Q24H   enoxaparin (LOVENOX) injection  40 mg Subcutaneous Q12H   furosemide  20 mg Oral Daily   insulin aspart  0-20 Units Subcutaneous TID WC   insulin aspart  0-5 Units Subcutaneous QHS   insulin aspart  4 Units Subcutaneous TID WC   [START ON 12/25/2019] insulin glargine  14 Units Subcutaneous Daily   mouth rinse  15 mL Mouth Rinse BID   metFORMIN  1,000 mg Oral BID WC   sodium chloride  2 spray Each Nare BID   Continuous Infusions:  remdesivir 100 mg in NS 100 mL 100 mg (12/24/19 0934)   PRN Meds:.acetaminophen, albuterol   Time Spent in minutes  25  See all Orders from today for further details   Jeoffrey Massed M.D on 12/24/2019 at 10:36 AM  To page go to [email or web address] - use universal password  [facility] -  Office  [phone]    Objective:   Vitals:   12/24/19 0558 12/24/19 0600 12/24/19 0820 12/24/19 0927  BP: 136/79  115/80 131/84  Pulse: 75  77 85  Resp: (!) 25 (!) 40 (!) 36   Temp: 97.6 F (36.4 C)  (!) 97.3 F (36.3 C)   TempSrc: Axillary  Axillary   SpO2: 97%  97%   Weight:      Height:        Wt Readings from Last  3 Encounters:  12/23/19 88.6 kg     Intake/Output Summary (Last 24 hours) at 12/24/2019 1036 Last data filed at 12/24/2019 1004 Gross per 24 hour  Intake 1050 ml  Output 1325 ml  Net -275 ml     Physical Exam Gen Exam:Alert awake-not in any distress HEENT:atraumatic, normocephalic Chest: B/L clear to auscultation anteriorly-but has bibasilar rales CVS:S1S2 regular Abdomen:soft non tender, non distended Extremities:no edema Neurology: Non focal Skin: no rash   Data Review:    CBC Recent Labs  Lab 12/21/19 2352 12/22/19 0358 12/23/19 0359 12/24/19 0309  WBC 6.9  --  14.0*  12.1*  HGB 14.1 13.6 14.8 15.6  HCT 42.7 40.0 43.7 47.5  PLT 289  --  507* 634*  MCV 91.0  --  89.9 91.9  MCH 30.1  --  30.5 30.2  MCHC 33.0  --  33.9 32.8  RDW 12.5  --  12.3 12.5    Chemistries  Recent Labs  Lab 12/21/19 2352 12/22/19 0358 12/23/19 0359 12/24/19 0309  NA 132* 133* 135 137  K 4.9 4.6 4.9 4.7  CL 101  --  103 98  CO2 21*  --  22 24  GLUCOSE 373*  --  290* 235*  BUN 32*  --  33* 36*  CREATININE 1.28*  --  1.00 1.03  CALCIUM 8.3*  --  8.7* 9.0  MG 2.0  --   --   --   AST 37  --  28  --   ALT 39  --  38  --   ALKPHOS 133*  --  144*  --   BILITOT 1.5*  --  1.2  --    ------------------------------------------------------------------------------------------------------------------ No results for input(s): CHOL, HDL, LDLCALC, TRIG, CHOLHDL, LDLDIRECT in the last 72 hours.  Lab Results  Component Value Date   HGBA1C 8.3 (H) 12/21/2019   ------------------------------------------------------------------------------------------------------------------ No results for input(s): TSH, T4TOTAL, T3FREE, THYROIDAB in the last 72 hours.  Invalid input(s): FREET3 ------------------------------------------------------------------------------------------------------------------ Recent Labs    12/21/19 2352 12/24/19 0735  FERRITIN 1,421* 1,082*    Coagulation profile No results for input(s): INR, PROTIME in the last 168 hours.  Recent Labs    12/21/19 2352 12/24/19 0309  DDIMER 2.36* 2.03*    Cardiac Enzymes No results for input(s): CKMB, TROPONINI, MYOGLOBIN in the last 168 hours.  Invalid input(s): CK ------------------------------------------------------------------------------------------------------------------    Component Value Date/Time   BNP 67.1 12/21/2019 2352    Micro Results Recent Results (from the past 240 hour(s))  Culture, blood (routine x 2)     Status: None (Preliminary result)   Collection Time: 12/21/19 11:18 PM   Specimen:  BLOOD RIGHT HAND  Result Value Ref Range Status   Specimen Description BLOOD RIGHT HAND  Final   Special Requests   Final    BOTTLES DRAWN AEROBIC AND ANAEROBIC Blood Culture adequate volume   Culture   Final    NO GROWTH 1 DAY Performed at [facility], [place] [address], [place], [place] [address]    Report Status PENDING  Incomplete  Culture, blood (routine x 2)     Status: None (Preliminary result)   Collection Time: 12/21/19 11:22 PM   Specimen: BLOOD LEFT HAND  Result Value Ref Range Status   Specimen Description BLOOD LEFT HAND  Final   Special Requests AEROBIC BOTTLE ONLY Blood Culture adequate volume  Final   Culture   Final    NO GROWTH 1 DAY Performed at [facility], [address] [place], [place] [address]    Report Status PENDING  Incomplete    Radiology Reports DG Chest Port 1 View  Result Date: 12/23/2019 CLINICAL DATA:  Respiratory failure.  COVID-19. EXAM: PORTABLE CHEST 1 VIEW COMPARISON:  December 22, 2019 FINDINGS: Bilateral pulmonary infiltrates are stable. The cardiomediastinal silhouette is stable. No pneumothorax. No other acute abnormalities. IMPRESSION: Stable bilateral pulmonary infiltrates consistent with history. Electronically Signed   By: Gerome Samavid  Williams III M.D   On: 12/23/2019 08:06   DG Chest Port 1 View  Result Date: 12/22/2019 CLINICAL DATA:  COVID-19 pneumonia. EXAM: PORTABLE CHEST 1 VIEW COMPARISON:  December 21, 2019. FINDINGS: The heart size and mediastinal contours are within normal limits. No pneumothorax or pleural effusion is noted. Stable right lung opacity is noted consistent with pneumonia. Mild left basilar atelectasis or infiltrate is noted. The visualized skeletal structures are unremarkable. IMPRESSION: Stable right lung opacity consistent with pneumonia. Mild left basilar atelectasis or infiltrate is noted. Electronically Signed   By: Lupita RaiderJames  Green Jr M.D.   On: 12/22/2019 08:46   ECHOCARDIOGRAM COMPLETE  Result Date:  12/23/2019    ECHOCARDIOGRAM REPORT   Patient Name:   Theodore CornsDWARD Redler Date of Exam: 12/22/2019 Medical Rec #:  [id number]    Height:       70.0 in Accession #:    [id number]   Weight:       212.5 lb Date of Birth:  1956-06-10    BSA:          2.142 m Patient Age:    63 years     BP:           131/87 mmHg Patient Gender: M            HR:           83 bpm. Exam Location:  Inpatient Procedure: 2D Echo, Color Doppler, Cardiac Doppler and Intracardiac            Opacification Agent Indications:    R06.02 SOB  History:        Patient has no prior history of Echocardiogram examinations.                 CV19.  Sonographer:    Roosvelt Maserachel Lane RDCS Referring Phys: [id number] [facility] LLC[facility]ATHANIEL M MEIER  Sonographer Comments: Technically difficult study due to poor echo windows. IMPRESSIONS  1. Left ventricular ejection fraction, by estimation, is 55 to 60%. The left ventricle has normal function. The left ventricle has no regional wall motion abnormalities. There is mild left ventricular hypertrophy. Left ventricular diastolic parameters are consistent with Grade I diastolic dysfunction (impaired relaxation).  2. Right ventricular systolic function is normal. The right ventricular size is normal. Tricuspid regurgitation signal is inadequate for assessing PA pressure.  3. The mitral valve is abnormal. No evidence of mitral valve regurgitation. No evidence of mitral stenosis.  4. Due to suboptimal acoustic windows, cannot exclude a small mobile echodensity on the aortic valve with independent motion. Best seen in clip 36. This might represent a calcification with artifact, cannot exclude mobile calcification or vegetation. The aortic valve is abnormal. Aortic valve regurgitation is trivial. No aortic stenosis is present. FINDINGS  Left Ventricle: Left ventricular ejection fraction, by estimation, is 55 to 60%. The left ventricle has normal function. The left ventricle has no regional wall motion abnormalities. Definity contrast agent was given IV to  delineate the left ventricular  endocardial borders. The left ventricular internal cavity size was normal in size.  There is mild left ventricular hypertrophy. Left ventricular diastolic parameters are consistent with Grade I diastolic dysfunction (impaired relaxation). Right Ventricle: The right ventricular size is normal. No increase in right ventricular wall thickness. Right ventricular systolic function is normal. Tricuspid regurgitation signal is inadequate for assessing PA pressure. Left Atrium: Left atrial size was normal in size. Right Atrium: Right atrial size was normal in size. Pericardium: There is no evidence of pericardial effusion. Mitral Valve: The mitral valve is abnormal. Normal mobility of the mitral valve leaflets. Mild mitral annular calcification. No evidence of mitral valve regurgitation. No evidence of mitral valve stenosis. Tricuspid Valve: The tricuspid valve is normal in structure. Tricuspid valve regurgitation is trivial. No evidence of tricuspid stenosis. Aortic Valve: Due to suboptimal acoustic windows, cannot exclude a small mobile echodensity on the aortic valve with independent motion. Best seen in clip 36. This might represent a calcification with artifact, cannot exclude mobile calcification or vegetation. The aortic valve is abnormal. Aortic valve regurgitation is trivial. No aortic stenosis is present. There is mild calcification of the aortic valve. Pulmonic Valve: The pulmonic valve was not well visualized. Pulmonic valve regurgitation is trivial. No evidence of pulmonic stenosis. Aorta: The aortic root is normal in size and structure. Venous: The inferior vena cava was not well visualized. IAS/Shunts: The interatrial septum was not well visualized.  LEFT VENTRICLE PLAX 2D LVIDd:         3.30 cm     Diastology LVIDs:         2.30 cm     LV e' lateral:   12.30 cm/s LV PW:         1.20 cm     LV E/e' lateral: 5.5 LV IVS:        1.20 cm     LV e' medial:    6.42 cm/s                             LV E/e' medial:  10.6  LV Volumes (MOD) LV vol d, MOD A4C: 62.7 ml LV vol s, MOD A4C: 26.9 ml LV SV MOD A4C:     62.7 ml RIGHT VENTRICLE RV Basal diam:  3.50 cm RV S prime:     14.30 cm/s TAPSE (M-mode): 1.9 cm LEFT ATRIUM             Index       RIGHT ATRIUM           Index LA diam:        3.10 cm 1.45 cm/m  RA Area:     18.70 cm LA Vol (A2C):   56.8 ml 26.52 ml/m RA Volume:   46.90 ml  21.90 ml/m LA Vol (A4C):   48.4 ml 22.60 ml/m LA Biplane Vol: 55.3 ml 25.82 ml/m  AORTIC VALVE LVOT Vmax:   77.70 cm/s LVOT Vmean:  49.400 cm/s LVOT VTI:    0.144 m  AORTA Ao Root diam: 2.70 cm MITRAL VALVE MV Area (PHT): 3.08 cm    SHUNTS MV Decel Time: 246 msec    Systemic VTI: 0.14 m MV E velocity: 68.10 cm/s MV A velocity: 79.30 cm/s MV E/A ratio:  0.86 Weston Brass MD Electronically signed by Weston Brass MD Signature Date/Time: 12/23/2019/10:12:24 AM    Final

## 2019-12-25 LAB — COMPREHENSIVE METABOLIC PANEL
ALT: 43 U/L (ref 0–44)
AST: 38 U/L (ref 15–41)
Albumin: 2.8 g/dL — ABNORMAL LOW (ref 3.5–5.0)
Alkaline Phosphatase: 174 U/L — ABNORMAL HIGH (ref 38–126)
Anion gap: 18 — ABNORMAL HIGH (ref 5–15)
BUN: 38 mg/dL — ABNORMAL HIGH (ref 8–23)
CO2: 17 mmol/L — ABNORMAL LOW (ref 22–32)
Calcium: 9.1 mg/dL (ref 8.9–10.3)
Chloride: 101 mmol/L (ref 98–111)
Creatinine, Ser: 1.2 mg/dL (ref 0.61–1.24)
GFR calc Af Amer: >60 mL/min (ref >60)
GFR calc non Af Amer: >60 mL/min (ref >60)
Glucose, Bld: 248 mg/dL — ABNORMAL HIGH (ref 70–99)
Potassium: 4.3 mmol/L (ref 3.5–5.1)
Sodium: 136 mmol/L (ref 135–145)
Total Bilirubin: 1.5 mg/dL — ABNORMAL HIGH (ref 0.3–1.2)
Total Protein: 8.6 g/dL — ABNORMAL HIGH (ref 6.5–8.1)

## 2019-12-25 LAB — C-REACTIVE PROTEIN: CRP: 2.9 mg/dL — ABNORMAL HIGH (ref <1.0)

## 2019-12-25 LAB — FERRITIN: Ferritin: 1195 ng/mL — ABNORMAL HIGH (ref 24–336)

## 2019-12-25 LAB — CBC
HCT: 52.9 % — ABNORMAL HIGH (ref 39.0–52.0)
Hemoglobin: 17.8 g/dL — ABNORMAL HIGH (ref 13.0–17.0)
MCH: 30.5 pg (ref 26.0–34.0)
MCHC: 33.6 g/dL (ref 30.0–36.0)
MCV: 90.7 fL (ref 80.0–100.0)
Platelets: 875 10*3/uL — ABNORMAL HIGH (ref 150–400)
RBC: 5.83 MIL/uL — ABNORMAL HIGH (ref 4.22–5.81)
RDW: 12.6 % (ref 11.5–15.5)
WBC: 17.7 10*3/uL — ABNORMAL HIGH (ref 4.0–10.5)
nRBC: 0.2 % (ref 0.0–0.2)

## 2019-12-25 LAB — GLUCOSE, CAPILLARY
Glucose-Capillary: 177 mg/dL — ABNORMAL HIGH (ref 70–99)
Glucose-Capillary: 146 mg/dL — ABNORMAL HIGH (ref 70–99)
Glucose-Capillary: 174 mg/dL — ABNORMAL HIGH (ref 70–99)
Glucose-Capillary: 191 mg/dL — ABNORMAL HIGH (ref 70–99)
Glucose-Capillary: 184 mg/dL — ABNORMAL HIGH (ref 70–99)

## 2019-12-25 LAB — D-DIMER, QUANTITATIVE: D-Dimer, Quant: 2.81 ug/mL-FEU — ABNORMAL HIGH (ref 0.00–0.50)

## 2019-12-25 LAB — MRSA PCR SCREENING: MRSA by PCR: NEGATIVE

## 2019-12-25 NOTE — Progress Notes (Signed)
 Occupational Therapy Evaluation Patient Details Name: Theodore Kim MRN: [id number] DOB: 1956/09/18 Today's Date: 12/25/2019    History of Present Illness Patient is a 64 y.o. male with PMHx of DM-2, HTN who was diagnosed with COVID-19 on 3/17-transferred from [facility] to [facility] ICU for worsening hypoxemia.  He was managed in the ICU-and upon stability transfer to the [facility].   Clinical Impression   PTA, pt lives with wife at home. Pt was Independent in all ADLs, IADLs, and mobility without AD. Pt was working full-time as a Psychologist, clinical. Presently, pt limited by decreased cardiopulmonary tolerance, requiring Min A for ADL tasks due to quick fatigue. Pt received on 8 L O2, 96% at rest. Pt min guard for sit to stand with and without AD, but demonstrated unsteadiness without AD. Pt min guard for mobility around foot of bed with RW, one LOB, and min guard for stand pivot to bedside. Pt supervision for bed mobility to return to supine.  During exertion, pt desat to 79% on 8 L O2, but recovered quickly (within 1 min). Guided pt in pursed lip breathing, but would benefit from reinforced education during next session. Pt pleasant and motivated to return to PLOF. Currently recommend HHOT and 24 hour assistance pending hopeful progress in functional abilities. If pt does not progress quickly, consider SNF for short-term rehab. Will continue to follow acutely and update recommendations as needed.     Follow Up Recommendations  Home health OT;Supervision/Assistance - 24 hour;SNF(pending improvement in mobility and endurance)    Equipment Recommendations  Other (comment)(TBD)    Recommendations for Other Services       Precautions / Restrictions Precautions Precautions: Fall;Other (comment) Precaution Comments: monitor oxygen saturation Restrictions Weight Bearing Restrictions: No      Mobility Bed Mobility Overal bed mobility: Needs Assistance Bed Mobility: Sit to  Supine       Sit to supine: Supervision   General bed mobility comments: Patient already in recliner chair, reports he has been up for a while.  Transfers Overall transfer level: Needs assistance Equipment used: Rolling walker (2 wheeled) Transfers: Sit to/from Stand Sit to Stand: Min guard Stand pivot transfers: Min guard       General transfer comment: sit<>stand trial 1 without AD, sit<>stand trial 2 with RW    Balance Overall balance assessment: Needs assistance Sitting-balance support: No upper extremity supported;Feet supported Sitting balance-Leahy Scale: Good     Standing balance support: No upper extremity supported Standing balance-Leahy Scale: Fair                             ADL either performed or assessed with clinical judgement   ADL Overall ADL's : Needs assistance/impaired Eating/Feeding: Set up;Sitting   Grooming: Set up;Wash/dry face;Sitting Grooming Details (indicate cue type and reason): cues needed to replace Brush in nose after task  Upper Body Bathing: Minimal assistance;Sitting   Lower Body Bathing: Minimal assistance;Sit to/from stand;Sitting/lateral leans   Upper Body Dressing : Minimal assistance;Sitting Upper Body Dressing Details (indicate cue type and reason): Min A to don hospital gown around back Lower Body Dressing: Minimal assistance;Sit to/from stand   Toilet Transfer: Min guard;Stand-pivot   Toileting- Architect and Hygiene: Minimal assistance;Sit to/from stand       Functional mobility during ADLs: Min guard;Rolling walker General ADL Comments: Due to decreased activity tolerance and desats, pt requires Min A for ADLs.      Vision Baseline Vision/History:  Wears glasses       Perception     Praxis      Pertinent Vitals/Pain Pain Assessment: No/denies pain     Hand Dominance Right   Extremity/Trunk Assessment Upper Extremity Assessment Upper Extremity Assessment: Overall WFL for tasks  assessed   Lower Extremity Assessment Lower Extremity Assessment: Defer to PT evaluation       Communication Communication Communication: No difficulties   Cognition Arousal/Alertness: Awake/alert Behavior During Therapy: WFL for tasks assessed/performed Overall Cognitive Status: Within Functional Limits for tasks assessed                                     General Comments  Patient on 8L HFNC, difficulty with PLB techique.    Exercises Exercises: Other exercises Other Exercises Other Exercises: flutter x 3 Other Exercises: incentive spirometer x 3, max 10105mL   Shoulder Instructions      Home Living Family/patient expects to be discharged to:: Private residence Living Arrangements: Spouse/significant other Available Help at Discharge: Family Type of Home: Mobile home Home Access: Stairs to enter Technical  of Steps: 3 Entrance Stairs-Rails: Can reach both Home Layout: One level     Bathroom Shower/Tub: Occupational psychologist: Standard     Home Equipment: Shower seat - built in          Prior Functioning/Environment Level of Independence: Independent        Comments: Pt was Independent in ADLs, iADLs, mobility without AD. Pt was still working full time as Radio broadcast assistant Problem List: Decreased activity tolerance;Impaired balance (sitting and/or standing);Decreased knowledge of use of DME or AE;Cardiopulmonary status limiting activity      OT Treatment/Interventions: Self-care/ADL training;Therapeutic exercise;Energy conservation;DME and/or AE instruction;Therapeutic activities;Patient/family education    OT Goals(Current goals can be found in the care plan section) Acute Rehab OT Goals Patient Stated Goal: get some rest  OT Goal Formulation: With patient Time For Goal Achievement: 01/08/20 Potential to Achieve Goals: Good ADL Goals Pt Will Perform Grooming: with modified  independence;standing Pt Will Perform Upper Body Bathing: with modified independence;sitting Pt Will Perform Lower Body Bathing: with modified independence;sit to/from stand Pt Will Perform Lower Body Dressing: with modified independence;sitting/lateral leans;sit to/from stand Pt Will Transfer to Toilet: with modified independence;ambulating;regular height toilet Pt Will Perform Toileting - Clothing Manipulation and hygiene: with modified independence;sitting/lateral leans;sit to/from stand Additional ADL Goal #1: Pt will verbalize at least 3 energy conservation strategies to implement during ADLs in order to maximize independence.  OT Frequency: Min 3X/week   Barriers to D/C:            Co-evaluation PT/OT/SLP Co-Evaluation/Treatment: Yes Reason for Co-Treatment: Complexity of the patient's impairments (multi-system involvement);Other (comment)(decreased patient activity tolerance) PT goals addressed during session: Mobility/safety with mobility;Proper use of DME;Balance OT goals addressed during session: ADL's and self-care      AM-PAC OT "6 Clicks" Daily Activity     Outcome Measure Help from another person eating meals?: A Little Help from another person taking care of personal grooming?: A Little Help from another person toileting, which includes using toliet, bedpan, or urinal?: A Little Help from another person bathing (including washing, rinsing, drying)?: A Little Help from another person to put on and taking off regular upper body clothing?: A Little Help from another person to put on and taking off regular lower body  clothing?: A Little 6 Click Score: 18   End of Session Equipment Utilized During Treatment: Gait belt;Rolling walker;Oxygen Nurse Communication: Mobility status;Other (comment)(O2 stats)  Activity Tolerance: Patient tolerated treatment well;Patient limited by fatigue Patient left: in bed;with call bell/phone within reach;with bed alarm set  OT Visit  Diagnosis: Unsteadiness on feet (R26.81);Other abnormalities of gait and mobility (R26.89);Muscle weakness (generalized) (M62.81)                Time: 5913-6859 OT Time Calculation (min): 33 min Charges:  OT General Charges $OT Visit: 1 Visit OT Evaluation $OT Eval Moderate Complexity: 1 Mod  Lorre Munroe, OTR/L  Lorre Munroe 12/25/2019, 1:45 PM

## 2019-12-25 NOTE — Progress Notes (Signed)
   12/24/19 2150  MEWS Score  Pulse Rate 91  ECG Heart Rate 90  Resp (!) 32  SpO2 96 %  O2 Device Venturi Mask  Patient Activity (if Appropriate) In bed (Side lying.)  O2 Flow Rate (L/min) 11 L/min  FiO2 (%) 45 %  MEWS Score  MEWS Temp 0  MEWS Systolic 0  MEWS Pulse 0  MEWS RR 2  MEWS LOC 0  MEWS Score 2  MEWS Score Color Yellow  MEWS Assessment  Is this an acute change? No  Provider Notification  Provider Name/Title M.Katherina Right, MD  Date Provider Notified 12/24/19  Time Provider Notified 2202  Notification Type Page  Notification Reason Other (Comment) (Patient states he feels like he's "smothering",anxious.)  Response See new orders  Date of Provider Response 12/24/19  Time of Provider Response 2251 (NP called this RN.)   Patient in bed, laying on right side. Patient states that he is unable to prone, does not want to get up in the chair since he was up in the chair for a while today. This RN offered to place HFNC on patient for only a few minutes for patient to use IS and flutter valve. This RN educated patient of importance of IS and flutter valve. Patient did not want to do this. Patient repeatedly stated that he needed Oxygen, even after this RN stated he had on O2 and SpO2 95-96% on Venti Mask 10L / 45%. RT unable to come to see patient when called, so instructed this RN to increase to 12L / 50%. CXR done and Ativan given IV per orders. Will continue to monitor.

## 2019-12-25 NOTE — Progress Notes (Signed)
PROGRESS NOTE                                                                                                                                                                                                             Patient Demographics:    Theodore Kim, is a 64 y.o. male, DOB - 03-09-1956, ZOX:[id number]  Outpatient Primary MD for the patient is No primary care provider on file.   Admit date - 12/21/2019   LOS - 4  No chief complaint on file.      Brief Narrative: Patient is a 64 y.o. male with PMHx of DM-2, HTN who was diagnosed with COVID-19 on 3/17-transferred from [facility] to [facility] ICU for worsening hypoxemia.  He was managed in the ICU-and upon stability transfer to the [facility].  See below for further details.  Significant Events: 3/19>> admit to [facility], ICU for worsening hypoxemia. 3/20>> TTE EF 55-60% 3/22>> transferred to [facility]  COVID-19 medications: Remdesivir: 3/19>>3/23 Steroids: 3/19>> Actemra: 3/19 x 1  Microbiology data: 3/19: Blood culture: Negative  DVT prophylaxis: Prophylactic Lovenox  Procedures: None  Consults: PCCM   Subjective:   Feels better-sitting in bedside chair-down to 8 L of HFNC.   Assessment  & Plan :   Acute Hypoxic Resp Failure due to Covid 19 Viral pneumonia: Continues to have severe hypoxemia but improved-titrated down to 8 L of HFNC.  Finish remdesivir on 3/23-continue steroids.  Continued attempts to titrate down FiO2.  Check BNP with a.m. labs  Fever: afebrile  O2 requirements:  SpO2: 96 % O2 Flow Rate (L/min): 8 L/min FiO2 (%): 50 %   COVID-19 Labs: Recent Labs    12/23/19 0359 12/24/19 0309 12/24/19 0735 12/25/19 0209  DDIMER  --  2.03*  --  2.81*  FERRITIN  --   --  1,082* 1,195*  CRP 14.3*  --  5.6* 2.9*       Component Value Date/Time   BNP 67.1 12/21/2019 2352    No results for input(s): PROCALCITON in the last  168 hours.  No results found for: SARSCOV2NAA   Prone/Incentive Spirometry: encouraged incentive spirometry/flutter use 3-4/hour.  DM-2 (A1c 8.3 on 12/21/2019) with mild hyperglycemia secondary to steroids: Stable-continue Lantus 14 units, avoidance of NovoLog with meals and SSI.  Continue to follow and optimize.    Recent Labs  12/25/19 0801 12/25/19 1135 12/25/19 1156  GLUCAP 177* 146* 174*   HTN: BP controlled-continue amlodipine  ABG:    Component Value Date/Time   PHART 7.413 12/22/2019 0358   PCO2ART 34.8 12/22/2019 0358   PO2ART 67.0 (L) 12/22/2019 0358   HCO3 22.3 12/22/2019 0358   TCO2 23 12/22/2019 0358   ACIDBASEDEF 2.0 12/22/2019 0358   O2SAT 94.0 12/22/2019 0358    Vent Settings: N/A  Condition - Guarded  Family Communication  :  Spouse updated over the phone 3/23  Code Status :  Full Code  Diet :  Diet Order            Diet heart healthy/carb modified Room service appropriate? Yes; Fluid consistency: Thin  Diet effective now               Disposition Plan  : Probably home with home health services or SNF when ready for discharge.  Barriers to discharge: Hypoxia requiring O2 supplementation/complete 5 days of IV Remdesivir  Antimicorbials  :    Anti-infectives (From admission, onward)   Start     Dose/Rate Route Frequency Ordered Stop   12/23/19 1000  remdesivir 100 mg in sodium chloride 0.9 % 100 mL IVPB  Status:  Discontinued     100 mg 200 mL/hr over 30 Minutes Intravenous Daily 12/21/19 2231 12/21/19 2246   12/22/19 1000  remdesivir 100 mg in sodium chloride 0.9 % 100 mL IVPB     100 mg 200 mL/hr over 30 Minutes Intravenous Daily 12/21/19 2248 12/25/19 0937   12/22/19 0900  remdesivir 200 mg in sodium chloride 0.9% 250 mL IVPB  Status:  Discontinued     200 mg 580 mL/hr over 30 Minutes Intravenous Once 12/21/19 2231 12/21/19 2246      Inpatient Medications  Scheduled Meds: . amLODipine  5 mg Oral Daily  . aspirin  81 mg Oral  Daily  . dexamethasone (DECADRON) injection  6 mg Intravenous Q24H  . enoxaparin (LOVENOX) injection  40 mg Subcutaneous Q12H  . furosemide  20 mg Oral Daily  . insulin aspart  0-20 Units Subcutaneous TID WC  . insulin aspart  0-5 Units Subcutaneous QHS  . insulin aspart  4 Units Subcutaneous TID WC  . insulin glargine  14 Units Subcutaneous Daily  . mouth rinse  15 mL Mouth Rinse BID  . metFORMIN  1,000 mg Oral BID WC  . sodium chloride  2 spray Each Nare BID   Continuous Infusions:  PRN Meds:.acetaminophen, albuterol   Time Spent in minutes  25  See all Orders from today for further details   Jeoffrey Massed M.D on 12/25/2019 at 1:10 PM  To page go to [email or web address] - use universal password  [facility] -  Office  [phone]    Objective:   Vitals:   12/25/19 0735 12/25/19 0904 12/25/19 0943 12/25/19 1159  BP: 103/77 (!) 126/91 (!) 122/91 132/87  Pulse: 97  86 (!) 102  Resp: (!) 30  (!) 30 (!) 34  Temp: 97.8 F (36.6 C)     TempSrc: Oral     SpO2: 97%  97% 96%  Weight:      Height:        Wt Readings from Last 3 Encounters:  12/25/19 88 kg     Intake/Output Summary (Last 24 hours) at 12/25/2019 1310 Last data filed at 12/25/2019 0819 Gross per 24 hour  Intake 600 ml  Output 915 ml  Net -315 ml  Physical Exam Gen Exam:Alert awake-not in any distress HEENT:atraumatic, normocephalic Chest: B/L clear to auscultation anteriorly CVS:S1S2 regular Abdomen:soft non tender, non distended Extremities:no edema Neurology: Non focal Skin: no rash   Data Review:    CBC Recent Labs  Lab 12/21/19 2352 12/22/19 0358 12/23/19 0359 12/24/19 0309 12/25/19 0209  WBC 6.9  --  14.0* 12.1* 17.7*  HGB 14.1 13.6 14.8 15.6 17.8*  HCT 42.7 40.0 43.7 47.5 52.9*  PLT 289  --  507* 634* 875*  MCV 91.0  --  89.9 91.9 90.7  MCH 30.1  --  30.5 30.2 30.5  MCHC 33.0  --  33.9 32.8 33.6  RDW 12.5  --  12.3 12.5 12.6    Chemistries  Recent Labs  Lab  12/21/19 2352 12/22/19 0358 12/23/19 0359 12/24/19 0309 12/25/19 0209  NA 132* 133* 135 137 136  K 4.9 4.6 4.9 4.7 4.3  CL 101  --  103 98 101  CO2 21*  --  22 24 17*  GLUCOSE 373*  --  290* 235* 248*  BUN 32*  --  33* 36* 38*  CREATININE 1.28*  --  1.00 1.03 1.20  CALCIUM 8.3*  --  8.7* 9.0 9.1  MG 2.0  --   --   --   --   AST 37  --  28  --  38  ALT 39  --  38  --  43  ALKPHOS 133*  --  144*  --  174*  BILITOT 1.5*  --  1.2  --  1.5*   ------------------------------------------------------------------------------------------------------------------ No results for input(s): CHOL, HDL, LDLCALC, TRIG, CHOLHDL, LDLDIRECT in the last 72 hours.  Lab Results  Component Value Date   HGBA1C 8.3 (H) 12/21/2019   ------------------------------------------------------------------------------------------------------------------ No results for input(s): TSH, T4TOTAL, T3FREE, THYROIDAB in the last 72 hours.  Invalid input(s): FREET3 ------------------------------------------------------------------------------------------------------------------ Recent Labs    12/24/19 0735 12/25/19 0209  FERRITIN 1,082* 1,195*    Coagulation profile No results for input(s): INR, PROTIME in the last 168 hours.  Recent Labs    12/24/19 0309 12/25/19 0209  DDIMER 2.03* 2.81*    Cardiac Enzymes No results for input(s): CKMB, TROPONINI, MYOGLOBIN in the last 168 hours.  Invalid input(s): CK ------------------------------------------------------------------------------------------------------------------    Component Value Date/Time   BNP 67.1 12/21/2019 2352    Micro Results Recent Results (from the past 240 hour(s))  Culture, blood (routine x 2)     Status: None (Preliminary result)   Collection Time: 12/21/19 11:18 PM   Specimen: BLOOD RIGHT HAND  Result Value Ref Range Status   Specimen Description BLOOD RIGHT HAND  Final   Special Requests   Final    BOTTLES DRAWN AEROBIC AND  ANAEROBIC Blood Culture adequate volume Performed at [facility], [address] [place], [place] [address]    Culture NO GROWTH 3 DAYS  Final   Report Status PENDING  Incomplete  Culture, blood (routine x 2)     Status: None (Preliminary result)   Collection Time: 12/21/19 11:22 PM   Specimen: BLOOD LEFT HAND  Result Value Ref Range Status   Specimen Description BLOOD LEFT HAND  Final   Special Requests   Final    AEROBIC BOTTLE ONLY Blood Culture adequate volume Performed at [facility], [address] [place], [place] [address]    Culture NO GROWTH 3 DAYS  Final   Report Status PENDING  Incomplete  MRSA PCR Screening     Status: None  Collection Time: 12/25/19  4:26 AM   Specimen: Nasal Mucosa; Nasopharyngeal  Result Value Ref Range Status   MRSA by PCR NEGATIVE NEGATIVE Final    Comment:        The GeneXpert MRSA Assay (FDA approved for NASAL specimens only), is one component of a comprehensive MRSA colonization surveillance program. It is not intended to diagnose MRSA infection nor to guide or monitor treatment for MRSA infections. Performed at [facility], [place] [address], [place], [place] [address]     Radiology Reports DG CHEST PORT 1 VIEW  Result Date: 12/24/2019 CLINICAL DATA:  Shortness of breath EXAM: PORTABLE CHEST 1 VIEW COMPARISON:  12/23/2019 FINDINGS: Chronic interstitial opacities compatible with chronic lung disease/fibrosis. Pulse heart is normal size. No effusions. No acute bony abnormality. IMPRESSION: Interstitial prominence throughout the lungs, likely chronic/fibrosis. Electronically Signed   By: Rolm Baptise M.D.   On: 12/24/2019 23:37   DG Chest Port 1 View  Result Date: 12/23/2019 CLINICAL DATA:  Respiratory failure.  COVID-19. EXAM: PORTABLE CHEST 1 VIEW COMPARISON:  December 22, 2019 FINDINGS: Bilateral pulmonary infiltrates are stable. The cardiomediastinal silhouette is stable. No pneumothorax. No other acute abnormalities.  IMPRESSION: Stable bilateral pulmonary infiltrates consistent with history. Electronically Signed   By: Dorise Bullion III M.D   On: 12/23/2019 08:06   DG Chest Port 1 View  Result Date: 12/22/2019 CLINICAL DATA:  COVID-19 pneumonia. EXAM: PORTABLE CHEST 1 VIEW COMPARISON:  December 21, 2019. FINDINGS: The heart size and mediastinal contours are within normal limits. No pneumothorax or pleural effusion is noted. Stable right lung opacity is noted consistent with pneumonia. Mild left basilar atelectasis or infiltrate is noted. The visualized skeletal structures are unremarkable. IMPRESSION: Stable right lung opacity consistent with pneumonia. Mild left basilar atelectasis or infiltrate is noted. Electronically Signed   By: Marijo Conception M.D.   On: 12/22/2019 08:46   ECHOCARDIOGRAM COMPLETE  Result Date: 12/23/2019    ECHOCARDIOGRAM REPORT   Patient Name:   KOL CONSUEGRA Date of Exam: 12/22/2019 Medical Rec #:  [id number]    Height:       70.0 in Accession #:    [id number]   Weight:       212.5 lb Date of Birth:  May 16, 1956    BSA:          2.142 m Patient Age:    8 years     BP:           131/87 mmHg Patient Gender: M            HR:           83 bpm. Exam Location:  Inpatient Procedure: 2D Echo, Color Doppler, Cardiac Doppler and Intracardiac            Opacification Agent Indications:    R06.02 SOB  History:        Patient has no prior history of Echocardiogram examinations.                 CV19.  Sonographer:    Merrie Roof RDCS Referring Phys: [id number] [facility]  Sonographer Comments: Technically difficult study due to poor echo windows. IMPRESSIONS  1. Left ventricular ejection fraction, by estimation, is 55 to 60%. The left ventricle has normal function. The left ventricle has no regional wall motion abnormalities. There is mild left ventricular hypertrophy. Left ventricular diastolic parameters are consistent with Grade I diastolic dysfunction (impaired relaxation).  2. Right ventricular systolic  function  is normal. The right ventricular size is normal. Tricuspid regurgitation signal is inadequate for assessing PA pressure.  3. The mitral valve is abnormal. No evidence of mitral valve regurgitation. No evidence of mitral stenosis.  4. Due to suboptimal acoustic windows, cannot exclude a small mobile echodensity on the aortic valve with independent motion. Best seen in clip 36. This might represent a calcification with artifact, cannot exclude mobile calcification or vegetation. The aortic valve is abnormal. Aortic valve regurgitation is trivial. No aortic stenosis is present. FINDINGS  Left Ventricle: Left ventricular ejection fraction, by estimation, is 55 to 60%. The left ventricle has normal function. The left ventricle has no regional wall motion abnormalities. Definity contrast agent was given IV to delineate the left ventricular  endocardial borders. The left ventricular internal cavity size was normal in size. There is mild left ventricular hypertrophy. Left ventricular diastolic parameters are consistent with Grade I diastolic dysfunction (impaired relaxation). Right Ventricle: The right ventricular size is normal. No increase in right ventricular wall thickness. Right ventricular systolic function is normal. Tricuspid regurgitation signal is inadequate for assessing PA pressure. Left Atrium: Left atrial size was normal in size. Right Atrium: Right atrial size was normal in size. Pericardium: There is no evidence of pericardial effusion. Mitral Valve: The mitral valve is abnormal. Normal mobility of the mitral valve leaflets. Mild mitral annular calcification. No evidence of mitral valve regurgitation. No evidence of mitral valve stenosis. Tricuspid Valve: The tricuspid valve is normal in structure. Tricuspid valve regurgitation is trivial. No evidence of tricuspid stenosis. Aortic Valve: Due to suboptimal acoustic windows, cannot exclude a small mobile echodensity on the aortic valve with  independent motion. Best seen in clip 36. This might represent a calcification with artifact, cannot exclude mobile calcification or vegetation. The aortic valve is abnormal. Aortic valve regurgitation is trivial. No aortic stenosis is present. There is mild calcification of the aortic valve. Pulmonic Valve: The pulmonic valve was not well visualized. Pulmonic valve regurgitation is trivial. No evidence of pulmonic stenosis. Aorta: The aortic root is normal in size and structure. Venous: The inferior vena cava was not well visualized. IAS/Shunts: The interatrial septum was not well visualized.  LEFT VENTRICLE PLAX 2D LVIDd:         3.30 cm     Diastology LVIDs:         2.30 cm     LV e' lateral:   12.30 cm/s LV PW:         1.20 cm     LV E/e' lateral: 5.5 LV IVS:        1.20 cm     LV e' medial:    6.42 cm/s                            LV E/e' medial:  10.6  LV Volumes (MOD) LV vol d, MOD A4C: 62.7 ml LV vol s, MOD A4C: 26.9 ml LV SV MOD A4C:     62.7 ml RIGHT VENTRICLE RV Basal diam:  3.50 cm RV S prime:     14.30 cm/s TAPSE (M-mode): 1.9 cm LEFT ATRIUM             Index       RIGHT ATRIUM           Index LA diam:        3.10 cm 1.45 cm/m  RA Area:     18.70 cm LA Vol (A2C):  56.8 ml 26.52 ml/m RA Volume:   46.90 ml  21.90 ml/m LA Vol (A4C):   48.4 ml 22.60 ml/m LA Biplane Vol: 55.3 ml 25.82 ml/m  AORTIC VALVE LVOT Vmax:   77.70 cm/s LVOT Vmean:  49.400 cm/s LVOT VTI:    0.144 m  AORTA Ao Root diam: 2.70 cm MITRAL VALVE MV Area (PHT): 3.08 cm    SHUNTS MV Decel Time: 246 msec    Systemic VTI: 0.14 m MV E velocity: 68.10 cm/s MV A velocity: 79.30 cm/s MV E/A ratio:  0.86 Weston Brass MD Electronically signed by Weston Brass MD Signature Date/Time: 12/23/2019/10:12:24 AM    Final

## 2019-12-25 NOTE — Progress Notes (Signed)
   12/25/19 0248  MEWS Score  Pulse Rate (!) 110  ECG Heart Rate (!) 112  Resp (!) 28  SpO2 (!) 79 %  O2 Device Room Air (Patient took off O2 mask again.)  MEWS Score  MEWS Temp 0  MEWS Systolic 0  MEWS Pulse 2  MEWS RR 2  MEWS LOC 0  MEWS Score 4  MEWS Score Color Red  MEWS Assessment  Is this an acute change? No (Patient took off O2 mask.)   Patient has removed O2 Venti mask multiple times while asleep. This RN applied O2 9L HFNC. Patient tolerating well so far. Will continue to monitor.

## 2019-12-25 NOTE — Progress Notes (Signed)
   Vital Signs MEWS/VS Documentation      12/25/2019 1159 12/25/2019 1535 12/25/2019 1600 12/25/2019 1900   MEWS Score:  3  4  3  3    MEWS Score Color:  Yellow  Red  Yellow  Yellow   Resp:  (!) 34  (!) 26  (!) 34  --   Pulse:  (!) 102  (!) 108  95  --   BP:  132/87  116/76  104/76  --   Temp:  --  (!) 96.7 F (35.9 C)  --  --   O2 Device:  --  --  HFNC  --   O2 Flow Rate (L/min):  --  --  8 L/min  --     Patient alert and oriented x4 last MEWS score of 3, due to increased respiration 33, and HR 102. Pt on 8L HFNC oxygen saturation 95%. No complaints of SOB or discomfort at this time.nurse continue to encourage spirometry and flutter during the day.      Theodore Kim 12/25/2019,7:32 PM

## 2019-12-25 NOTE — Progress Notes (Signed)
   12/25/19 0010  MEWS Score  Pulse Rate 97  ECG Heart Rate 97  Resp (!) 35  SpO2 (!) 84 %  O2 Device Room Air (Patient took off Venti mask.)  Patient Activity (if Appropriate) In bed  MEWS Score  MEWS Temp 0  MEWS Systolic 0  MEWS Pulse 0  MEWS RR 2  MEWS LOC 0  MEWS Score 2  MEWS Score Color Yellow  MEWS Assessment  Is this an acute change? No  Provider Notification  Provider Name/Title M. Katherina Right, NP  Notification Type Call  Notification Reason Other (Comment) (NP asking of patient status and notes resp. 30s-40s.)  Response No new orders  Date of Provider Response 12/25/19  Time of Provider Response 0011 (NP called this RN.)   Patient's SpO2 noticed to be 80s. Patient found to have O2 Venti mask off. Patient easily arousable, O2 venti mask re-applied. M. Katherina Right, NP aware. Will continue to monitor.

## 2019-12-25 NOTE — Progress Notes (Signed)
Physical Therapy Evaluation  Patient presents with decreased activity tolerance, desats on 8L HFNC with minimal ambulation around the bed using RW and one assist. He does recover within one minute once back to bed. Patient demonstrates difficulty with PLB technique. Pending length of hospitalization and mobility progression/tolerance with physical therapy services in the hospital setting recommend SNF vs home PT with assist from his wife. She has recovered from COVID quicker than patient per patient report.    12/25/19 1156  PT Visit Information  Last PT Received On 12/25/19  Assistance Needed +1  PT/OT/SLP Co-Evaluation/Treatment Yes  Reason for Co-Treatment Complexity of the patient's impairments (multi-system involvement);Other (comment) (decreased patient activity tolerance)  PT goals addressed during session Mobility/safety with mobility;Proper use of DME;Balance  History of Present Illness 64 year old male admitted 12/21/19, transferred from [facility], to ICU for worsening hypoxemia. Patient was diagnosed with COVID on 12/19/19. PMH: DM2  Precautions  Precautions Fall;Other (comment)  Precaution Comments monitor oxygen saturation  Restrictions  Weight Bearing Restrictions No  Home Living  Family/patient expects to be discharged to: Private residence  Living Arrangements Spouse/significant other  Available Help at Discharge Family  Type of Home Mobile home  Home Access Stairs to enter  Entrance Stairs-Number of Steps 3  Entrance Stairs-Rails Can reach both  Home Layout One level  Education officer, museum seat - built in  Prior Function  Level of Independence Independent  Comments Indepedent PTA, no AD, works full time as Psychologist, clinical  Pain Assessment  Pain Assessment No/denies pain  Cognition  Arousal/Alertness Awake/alert  Behavior During Therapy WFL for tasks assessed/performed  Overall Cognitive  Status Within Functional Limits for tasks assessed  Lower Extremity Assessment  Lower Extremity Assessment  (BLE strength grossly 4+/5 except hip flexion 4/5)  Bed Mobility  Overal bed mobility Needs Assistance  Bed Mobility Sit to Supine  Sit to supine Supervision  General bed mobility comments Patient already in recliner chair, reports he has been up for a while, requesting back to bed at end of session.  Transfers  Overall transfer level Needs assistance  Equipment used Rolling walker (2 wheeled)  Transfers Sit to/from Stand  Sit to Stand Min guard  General transfer comment sit<>stand trial 1 without AD, sit<>stand trial 2 with RW  Ambulation/Gait  Ambulation/Gait assistance Min guard;Min assist  Gait Distance (Feet) 10 Feet  Assistive device Rolling walker (2 wheeled)  Gait Pattern/deviations Decreased step length - right;Decreased step length - left  General Gait Details Cues to stand closer to RW for improved safety and mobility. One instance near LOB requiring minA from PT and patient utilizing stepping response.  (desats to 79% on 8L, recovers in approx 1 minute)  Gait velocity decreased  Stairs  (would not be able to tolerate yet)  Balance  Overall balance assessment Needs assistance  Standing balance support No upper extremity supported  Standing balance-Leahy Scale Fair  General Comments  General comments (skin integrity, edema, etc.) Patient on 8L HFNC, difficulty with PLB techique, desats with ambulation to 79%, recovers to >/=90% within one minute once back to bed   Exercises  Exercises Other exercises  Other Exercises  Other Exercises flutter x 3  Other Exercises incentive spirometer x 3, max  PT - End of Session  Equipment Utilized During Treatment Gait belt;Oxygen  Activity Tolerance Patient limited by fatigue  Patient left in bed;with call bell/phone within reach;with bed alarm set  Nurse  Communication Mobility status;Other (comment) (oxygen  saturation response)  PT Assessment  PT Recommendation/Assessment Patient needs continued PT services  PT Visit Diagnosis Unsteadiness on feet (R26.81);Other abnormalities of gait and mobility (R26.89);Muscle weakness (generalized) (M62.81)  PT Problem List Decreased strength;Decreased activity tolerance;Decreased balance;Decreased mobility;Decreased knowledge of use of DME;Cardiopulmonary status limiting activity  Barriers to Discharge Comments activity tolerance  PT Plan  PT Frequency (ACUTE ONLY) Min 3X/week  PT Treatment/Interventions (ACUTE ONLY) DME instruction;Gait training;Stair training;Functional mobility training;Therapeutic activities;Therapeutic exercise;Balance training;Patient/family education  AM-PAC PT "6 Clicks" Mobility Outcome Measure (Version 2)  Help needed turning from your back to your side while in a flat bed without using bedrails? 4  Help needed moving from lying on your back to sitting on the side of a flat bed without using bedrails? 4  Help needed moving to and from a bed to a chair (including a wheelchair)? 3  Help needed standing up from a chair using your arms (e.g., wheelchair or bedside chair)? 3  Help needed to walk in hospital room? 3  Help needed climbing 3-5 steps with a railing?  2  6 Click Score 19  Consider Recommendation of Discharge To: Home with [facility]  PT Recommendation  Follow Up Recommendations SNF;Home health PT;Supervision/Assistance - 24 hour ((pending mobility progression and tolerance))  PT equipment Rolling walker with 5" wheels;3in1 (PT)  Individuals Consulted  Consulted and Agree with Results and Recommendations Patient  Acute Rehab PT Goals  Time For Goal Achievement 01/07/20  Potential to Achieve Goals Good  PT Time Calculation  PT Start Time (ACUTE ONLY) 1156  PT Stop Time (ACUTE ONLY) 1225  PT Time Calculation (min) (ACUTE ONLY) 29 min  PT General Charges  $$ ACUTE PT VISIT 1 Visit  PT Evaluation  $PT Eval Moderate Complexity 1  Mod  Written Expression  Dominant Hand Right   Birdie Hopes, PT, DPT Acute Rehab [phone] office

## 2019-12-26 LAB — COMPREHENSIVE METABOLIC PANEL
ALT: 38 U/L (ref 0–44)
AST: 27 U/L (ref 15–41)
Albumin: 2.8 g/dL — ABNORMAL LOW (ref 3.5–5.0)
Alkaline Phosphatase: 155 U/L — ABNORMAL HIGH (ref 38–126)
Anion gap: 16 — ABNORMAL HIGH (ref 5–15)
BUN: 40 mg/dL — ABNORMAL HIGH (ref 8–23)
CO2: 18 mmol/L — ABNORMAL LOW (ref 22–32)
Calcium: 9 mg/dL (ref 8.9–10.3)
Chloride: 102 mmol/L (ref 98–111)
Creatinine, Ser: 1.2 mg/dL (ref 0.61–1.24)
GFR calc Af Amer: >60 mL/min (ref >60)
GFR calc non Af Amer: >60 mL/min (ref >60)
Glucose, Bld: 200 mg/dL — ABNORMAL HIGH (ref 70–99)
Potassium: 5.2 mmol/L — ABNORMAL HIGH (ref 3.5–5.1)
Sodium: 136 mmol/L (ref 135–145)
Total Bilirubin: 1.5 mg/dL — ABNORMAL HIGH (ref 0.3–1.2)
Total Protein: 7.7 g/dL (ref 6.5–8.1)

## 2019-12-26 LAB — GLUCOSE, CAPILLARY
Glucose-Capillary: 180 mg/dL — ABNORMAL HIGH (ref 70–99)
Glucose-Capillary: 155 mg/dL — ABNORMAL HIGH (ref 70–99)
Glucose-Capillary: 174 mg/dL — ABNORMAL HIGH (ref 70–99)
Glucose-Capillary: 138 mg/dL — ABNORMAL HIGH (ref 70–99)

## 2019-12-26 LAB — CBC
HCT: 54.1 % — ABNORMAL HIGH (ref 39.0–52.0)
Hemoglobin: 18 g/dL — ABNORMAL HIGH (ref 13.0–17.0)
MCH: 29.9 pg (ref 26.0–34.0)
MCHC: 33.3 g/dL (ref 30.0–36.0)
MCV: 89.7 fL (ref 80.0–100.0)
Platelets: 676 10*3/uL — ABNORMAL HIGH (ref 150–400)
RBC: 6.03 MIL/uL — ABNORMAL HIGH (ref 4.22–5.81)
RDW: 12.6 % (ref 11.5–15.5)
WBC: 11.8 10*3/uL — ABNORMAL HIGH (ref 4.0–10.5)
nRBC: 0 % (ref 0.0–0.2)

## 2019-12-26 LAB — C-REACTIVE PROTEIN: CRP: 1.1 mg/dL — ABNORMAL HIGH (ref <1.0)

## 2019-12-26 LAB — BRAIN NATRIURETIC PEPTIDE: B Natriuretic Peptide: 18.2 pg/mL (ref 0.0–100.0)

## 2019-12-26 LAB — D-DIMER, QUANTITATIVE: D-Dimer, Quant: 2.74 ug/mL-FEU — ABNORMAL HIGH (ref 0.00–0.50)

## 2019-12-26 LAB — FERRITIN: Ferritin: 1109 ng/mL — ABNORMAL HIGH (ref 24–336)

## 2019-12-26 NOTE — Plan of Care (Signed)

## 2019-12-26 NOTE — Progress Notes (Signed)
PROGRESS NOTE                                                                                                                                                                                                             Patient Demographics:    Theodore Kim, is a 64 y.o. male, DOB - 09-12-1956, QJJ:[id number]  Outpatient Primary MD for the patient is No primary care provider on file.   Admit date - 12/21/2019   LOS - 5  No chief complaint on file.      Brief Narrative: Patient is a 64 y.o. male with PMHx of DM-2, HTN who was diagnosed with COVID-19 on 3/17-transferred from [facility] to [facility] ICU for worsening hypoxemia.  He was managed in the ICU-and upon stability transfer to the [facility].  See below for further details.  Significant Events: 3/19>> admit to [facility], ICU for worsening hypoxemia. 3/20>> TTE EF 55-60% 3/22>> transferred to [facility]  COVID-19 medications: Remdesivir: 3/19>>3/23 Steroids: 3/19>> Actemra: 3/19 x 1  Microbiology data: 3/19: Blood culture: Negative  DVT prophylaxis: Prophylactic Lovenox  Procedures: None  Consults: PCCM   Subjective:   No complaints overnight-improving-down to 5 L of oxygen this morning.   Assessment  & Plan :   Acute Hypoxic Resp Failure due to Covid 19 Viral pneumonia: Improving-severely hypoxemic on admission-but over the past few days gradually improving-down to 5 L of HFNC today.  Continue with attempts to titrate down FiO2.   Fever: afebrile  O2 requirements:  SpO2: 91 % O2 Flow Rate (L/min): 5 L/min FiO2 (%): 50 %   COVID-19 Labs: Recent Labs    12/24/19 0309 12/24/19 0735 12/25/19 0209 12/26/19 0314  DDIMER 2.03*  --  2.81* 2.74*  FERRITIN  --  1,082* 1,195* 1,109*  CRP  --  5.6* 2.9* 1.1*       Component Value Date/Time   BNP 18.2 12/26/2019 0314    No results for input(s): PROCALCITON in the last 168  hours.  No results found for: SARSCOV2NAA   Prone/Incentive Spirometry: encouraged incentive spirometry/flutter use 3-4/hour.  DM-2 (A1c 8.3 on 12/21/2019) with mild hyperglycemia secondary to steroids: Stable-continue Lantus 14 units, 4 units of NovoLog with meals and SSI.  Continue to follow and optimize.     Recent Labs    12/25/19 2029 12/26/19 0810 12/26/19  1201  GLUCAP 184* 180* 155*   HTN: BP controlled-continue amlodipine  ABG:    Component Value Date/Time   PHART 7.413 12/22/2019 0358   PCO2ART 34.8 12/22/2019 0358   PO2ART 67.0 (L) 12/22/2019 0358   HCO3 22.3 12/22/2019 0358   TCO2 23 12/22/2019 0358   ACIDBASEDEF 2.0 12/22/2019 0358   O2SAT 94.0 12/22/2019 0358    Vent Settings: N/A  Condition - Guarded  Family Communication  :  Spouse updated over the phone 3/24  Code Status :  Full Code  Diet :  Diet Order            Diet heart healthy/carb modified Room service appropriate? Yes; Fluid consistency: Thin  Diet effective now               Disposition Plan  : Probably home with home health services or SNF when ready for discharge-down to 2-3 L of oxygen consistently  Barriers to discharge: Hypoxia requiring O2 supplementation/complete 5 days of IV Remdesivir  Antimicorbials  :    Anti-infectives (From admission, onward)   Start     Dose/Rate Route Frequency Ordered Stop   12/23/19 1000  remdesivir 100 mg in sodium chloride 0.9 % 100 mL IVPB  Status:  Discontinued     100 mg 200 mL/hr over 30 Minutes Intravenous Daily 12/21/19 2231 12/21/19 2246   12/22/19 1000  remdesivir 100 mg in sodium chloride 0.9 % 100 mL IVPB     100 mg 200 mL/hr over 30 Minutes Intravenous Daily 12/21/19 2248 12/25/19 0937   12/22/19 0900  remdesivir 200 mg in sodium chloride 0.9% 250 mL IVPB  Status:  Discontinued     200 mg 580 mL/hr over 30 Minutes Intravenous Once 12/21/19 2231 12/21/19 2246      Inpatient Medications  Scheduled Meds: . amLODipine  5 mg Oral  Daily  . aspirin  81 mg Oral Daily  . dexamethasone (DECADRON) injection  6 mg Intravenous Q24H  . enoxaparin (LOVENOX) injection  40 mg Subcutaneous Q12H  . furosemide  20 mg Oral Daily  . insulin aspart  0-20 Units Subcutaneous TID WC  . insulin aspart  0-5 Units Subcutaneous QHS  . insulin aspart  4 Units Subcutaneous TID WC  . insulin glargine  14 Units Subcutaneous Daily  . mouth rinse  15 mL Mouth Rinse BID  . metFORMIN  1,000 mg Oral BID WC  . sodium chloride  2 spray Each Nare BID   Continuous Infusions:  PRN Meds:.acetaminophen, albuterol   Time Spent in minutes  25  See all Orders from today for further details   [name] M.D on 12/26/2019 at 12:09 PM  To page go to [email or web address] - use universal password  [facility] -  Office  [phone]    Objective:   Vitals:   12/26/19 0416 12/26/19 0744 12/26/19 0800 12/26/19 0827  BP:   104/78 111/75  Pulse: 79 83 89   Resp: (!) 27 (!) 30 (!) 31   Temp:   97.8 F (36.6 C)   TempSrc:   Oral   SpO2: 95% 95% 91%   Weight:      Height:        Wt Readings from Last 3 Encounters:  12/25/19 88 kg     Intake/Output Summary (Last 24 hours) at 12/26/2019 1209 Last data filed at 12/26/2019 0951 Gross per 24 hour  Intake 240 ml  Output 225 ml  Net 15 ml     Physical Exam  Gen Exam:Alert awake-not in any distress HEENT:atraumatic, normocephalic Chest: B/L clear to auscultation anteriorly CVS:S1S2 regular Abdomen:soft non tender, non distended Extremities:no edema Neurology: Non focal Skin: no rash   Data Review:    CBC Recent Labs  Lab 12/21/19 2352 12/21/19 2352 12/22/19 0358 12/23/19 0359 12/24/19 0309 12/25/19 0209 12/26/19 0314  WBC 6.9  --   --  14.0* 12.1* 17.7* 11.8*  HGB 14.1   < > 13.6 14.8 15.6 17.8* 18.0*  HCT 42.7   < > 40.0 43.7 47.5 52.9* 54.1*  PLT 289  --   --  507* 634* 875* 676*  MCV 91.0  --   --  89.9 91.9 90.7 89.7  MCH 30.1  --   --  30.5 30.2 30.5 29.9  MCHC  33.0  --   --  33.9 32.8 33.6 33.3  RDW 12.5  --   --  12.3 12.5 12.6 12.6   < > = values in this interval not displayed.    Chemistries  Recent Labs  Lab 12/21/19 2352 12/21/19 2352 12/22/19 0358 12/23/19 0359 12/24/19 0309 12/25/19 0209 12/26/19 0314  NA 132*   < > 133* 135 137 136 136  K 4.9   < > 4.6 4.9 4.7 4.3 5.2*  CL 101  --   --  103 98 101 102  CO2 21*  --   --  22 24 17* 18*  GLUCOSE 373*  --   --  290* 235* 248* 200*  BUN 32*  --   --  33* 36* 38* 40*  CREATININE 1.28*  --   --  1.00 1.03 1.20 1.20  CALCIUM 8.3*  --   --  8.7* 9.0 9.1 9.0  MG 2.0  --   --   --   --   --   --   AST 37  --   --  28  --  38 27  ALT 39  --   --  38  --  43 38  ALKPHOS 133*  --   --  144*  --  174* 155*  BILITOT 1.5*  --   --  1.2  --  1.5* 1.5*   < > = values in this interval not displayed.   ------------------------------------------------------------------------------------------------------------------ No results for input(s): CHOL, HDL, LDLCALC, TRIG, CHOLHDL, LDLDIRECT in the last 72 hours.  Lab Results  Component Value Date   HGBA1C 8.3 (H) 12/21/2019   ------------------------------------------------------------------------------------------------------------------ No results for input(s): TSH, T4TOTAL, T3FREE, THYROIDAB in the last 72 hours.  Invalid input(s): FREET3 ------------------------------------------------------------------------------------------------------------------ Recent Labs    12/25/19 0209 12/26/19 0314  FERRITIN 1,195* 1,109*    Coagulation profile No results for input(s): INR, PROTIME in the last 168 hours.  Recent Labs    12/25/19 0209 12/26/19 0314  DDIMER 2.81* 2.74*    Cardiac Enzymes No results for input(s): CKMB, TROPONINI, MYOGLOBIN in the last 168 hours.  Invalid input(s): CK ------------------------------------------------------------------------------------------------------------------    Component Value Date/Time   BNP  18.2 12/26/2019 0314    Micro Results Recent Results (from the past 240 hour(s))  Culture, blood (routine x 2)     Status: None (Preliminary result)   Collection Time: 12/21/19 11:18 PM   Specimen: BLOOD RIGHT HAND  Result Value Ref Range Status   Specimen Description BLOOD RIGHT HAND  Final   Special Requests   Final    BOTTLES DRAWN AEROBIC AND ANAEROBIC Blood Culture adequate volume   Culture   Final    NO GROWTH  4 DAYS Performed at [facility], [address], [place], [place] [address]    Report Status PENDING  Incomplete  Culture, blood (routine x 2)     Status: None (Preliminary result)   Collection Time: 12/21/19 11:22 PM   Specimen: BLOOD LEFT HAND  Result Value Ref Range Status   Specimen Description BLOOD LEFT HAND  Final   Special Requests AEROBIC BOTTLE ONLY Blood Culture adequate volume  Final   Culture   Final    NO GROWTH 4 DAYS Performed at [facility], [address] [place], [place] [address]    Report Status PENDING  Incomplete  MRSA PCR Screening     Status: None   Collection Time: 12/25/19  4:26 AM   Specimen: Nasal Mucosa; Nasopharyngeal  Result Value Ref Range Status   MRSA by PCR NEGATIVE NEGATIVE Final    Comment:        The GeneXpert MRSA Assay (FDA approved for NASAL specimens only), is one component of a comprehensive MRSA colonization surveillance program. It is not intended to diagnose MRSA infection nor to guide or monitor treatment for MRSA infections. Performed at [facility], [address] [place], [place] [address]     Radiology Reports DG CHEST PORT 1 VIEW  Result Date: 12/24/2019 CLINICAL DATA:  Shortness of breath EXAM: PORTABLE CHEST 1 VIEW COMPARISON:  12/23/2019 FINDINGS: Chronic interstitial opacities compatible with chronic lung disease/fibrosis. Pulse heart is normal size. No effusions. No acute bony abnormality. IMPRESSION: Interstitial prominence throughout the lungs, likely chronic/fibrosis.  Electronically Signed   By: Charlett Nose M.D.   On: 12/24/2019 23:37   DG Chest Port 1 View  Result Date: 12/23/2019 CLINICAL DATA:  Respiratory failure.  COVID-19. EXAM: PORTABLE CHEST 1 VIEW COMPARISON:  December 22, 2019 FINDINGS: Bilateral pulmonary infiltrates are stable. The cardiomediastinal silhouette is stable. No pneumothorax. No other acute abnormalities. IMPRESSION: Stable bilateral pulmonary infiltrates consistent with history. Electronically Signed   By: Gerome Sam III M.D   On: 12/23/2019 08:06   DG Chest Port 1 View  Result Date: 12/22/2019 CLINICAL DATA:  COVID-19 pneumonia. EXAM: PORTABLE CHEST 1 VIEW COMPARISON:  December 21, 2019. FINDINGS: The heart size and mediastinal contours are within normal limits. No pneumothorax or pleural effusion is noted. Stable right lung opacity is noted consistent with pneumonia. Mild left basilar atelectasis or infiltrate is noted. The visualized skeletal structures are unremarkable. IMPRESSION: Stable right lung opacity consistent with pneumonia. Mild left basilar atelectasis or infiltrate is noted. Electronically Signed   By: Lupita Raider M.D.   On: 12/22/2019 08:46   ECHOCARDIOGRAM COMPLETE  Result Date: 12/23/2019    ECHOCARDIOGRAM REPORT   Patient Name:   Theodore Kim Date of Exam: 12/22/2019 Medical Rec #:  [id number]    Height:       70.0 in Accession #:    [id number]   Weight:       212.5 lb Date of Birth:  1956-08-15    BSA:          2.142 m Patient Age:    63 years     BP:           131/87 mmHg Patient Gender: M            HR:           83 bpm. Exam Location:  Inpatient Procedure: 2D Echo, Color Doppler, Cardiac Doppler and Intracardiac  Opacification Agent Indications:    R06.02 SOB  History:        Patient has no prior history of Echocardiogram examinations.                 CV19.  Sonographer:    Merrie Roof RDCS Referring Phys: [id number] [facility]  Sonographer Comments: Technically difficult study due to poor echo windows.  IMPRESSIONS  1. Left ventricular ejection fraction, by estimation, is 55 to 60%. The left ventricle has normal function. The left ventricle has no regional wall motion abnormalities. There is mild left ventricular hypertrophy. Left ventricular diastolic parameters are consistent with Grade I diastolic dysfunction (impaired relaxation).  2. Right ventricular systolic function is normal. The right ventricular size is normal. Tricuspid regurgitation signal is inadequate for assessing PA pressure.  3. The mitral valve is abnormal. No evidence of mitral valve regurgitation. No evidence of mitral stenosis.  4. Due to suboptimal acoustic windows, cannot exclude a small mobile echodensity on the aortic valve with independent motion. Best seen in clip 36. This might represent a calcification with artifact, cannot exclude mobile calcification or vegetation. The aortic valve is abnormal. Aortic valve regurgitation is trivial. No aortic stenosis is present. FINDINGS  Left Ventricle: Left ventricular ejection fraction, by estimation, is 55 to 60%. The left ventricle has normal function. The left ventricle has no regional wall motion abnormalities. Definity contrast agent was given IV to delineate the left ventricular  endocardial borders. The left ventricular internal cavity size was normal in size. There is mild left ventricular hypertrophy. Left ventricular diastolic parameters are consistent with Grade I diastolic dysfunction (impaired relaxation). Right Ventricle: The right ventricular size is normal. No increase in right ventricular wall thickness. Right ventricular systolic function is normal. Tricuspid regurgitation signal is inadequate for assessing PA pressure. Left Atrium: Left atrial size was normal in size. Right Atrium: Right atrial size was normal in size. Pericardium: There is no evidence of pericardial effusion. Mitral Valve: The mitral valve is abnormal. Normal mobility of the mitral valve leaflets. Mild mitral  annular calcification. No evidence of mitral valve regurgitation. No evidence of mitral valve stenosis. Tricuspid Valve: The tricuspid valve is normal in structure. Tricuspid valve regurgitation is trivial. No evidence of tricuspid stenosis. Aortic Valve: Due to suboptimal acoustic windows, cannot exclude a small mobile echodensity on the aortic valve with independent motion. Best seen in clip 36. This might represent a calcification with artifact, cannot exclude mobile calcification or vegetation. The aortic valve is abnormal. Aortic valve regurgitation is trivial. No aortic stenosis is present. There is mild calcification of the aortic valve. Pulmonic Valve: The pulmonic valve was not well visualized. Pulmonic valve regurgitation is trivial. No evidence of pulmonic stenosis. Aorta: The aortic root is normal in size and structure. Venous: The inferior vena cava was not well visualized. IAS/Shunts: The interatrial septum was not well visualized.  LEFT VENTRICLE PLAX 2D LVIDd:         3.30 cm     Diastology LVIDs:         2.30 cm     LV e' lateral:   12.30 cm/s LV PW:         1.20 cm     LV E/e' lateral: 5.5 LV IVS:        1.20 cm     LV e' medial:    6.42 cm/s  LV E/e' medial:  10.6  LV Volumes (MOD) LV vol d, MOD A4C: 62.7 ml LV vol s, MOD A4C: 26.9 ml LV SV MOD A4C:     62.7 ml RIGHT VENTRICLE RV Basal diam:  3.50 cm RV S prime:     14.30 cm/s TAPSE (M-mode): 1.9 cm LEFT ATRIUM             Index       RIGHT ATRIUM           Index LA diam:        3.10 cm 1.45 cm/m  RA Area:     18.70 cm LA Vol (A2C):   56.8 ml 26.52 ml/m RA Volume:   46.90 ml  21.90 ml/m LA Vol (A4C):   48.4 ml 22.60 ml/m LA Biplane Vol: 55.3 ml 25.82 ml/m  AORTIC VALVE LVOT Vmax:   77.70 cm/s LVOT Vmean:  49.400 cm/s LVOT VTI:    0.144 m  AORTA Ao Root diam: 2.70 cm MITRAL VALVE MV Area (PHT): 3.08 cm    SHUNTS MV Decel Time: 246 msec    Systemic VTI: 0.14 m MV E velocity: 68.10 cm/s MV A velocity: 79.30 cm/s MV  E/A ratio:  0.86 Weston Brass MD Electronically signed by Weston Brass MD Signature Date/Time: 12/23/2019/10:12:24 AM    Final

## 2019-12-27 ENCOUNTER — Inpatient Hospital Stay (HOSPITAL_COMMUNITY): Payer: HRSA Program

## 2019-12-27 LAB — GLUCOSE, CAPILLARY
Glucose-Capillary: 180 mg/dL — ABNORMAL HIGH (ref 70–99)
Glucose-Capillary: 191 mg/dL — ABNORMAL HIGH (ref 70–99)
Glucose-Capillary: 162 mg/dL — ABNORMAL HIGH (ref 70–99)
Glucose-Capillary: 201 mg/dL — ABNORMAL HIGH (ref 70–99)

## 2019-12-27 LAB — COMPREHENSIVE METABOLIC PANEL
ALT: 36 U/L (ref 0–44)
AST: 27 U/L (ref 15–41)
Albumin: 2.8 g/dL — ABNORMAL LOW (ref 3.5–5.0)
Alkaline Phosphatase: 157 U/L — ABNORMAL HIGH (ref 38–126)
Anion gap: 15 (ref 5–15)
BUN: 45 mg/dL — ABNORMAL HIGH (ref 8–23)
CO2: 21 mmol/L — ABNORMAL LOW (ref 22–32)
Calcium: 9 mg/dL (ref 8.9–10.3)
Chloride: 98 mmol/L (ref 98–111)
Creatinine, Ser: 1.21 mg/dL (ref 0.61–1.24)
GFR calc Af Amer: >60 mL/min (ref >60)
GFR calc non Af Amer: >60 mL/min (ref >60)
Glucose, Bld: 183 mg/dL — ABNORMAL HIGH (ref 70–99)
Potassium: 5 mmol/L (ref 3.5–5.1)
Sodium: 134 mmol/L — ABNORMAL LOW (ref 135–145)
Total Bilirubin: 1.5 mg/dL — ABNORMAL HIGH (ref 0.3–1.2)
Total Protein: 7.4 g/dL (ref 6.5–8.1)

## 2019-12-27 LAB — CBC
HCT: 53.9 % — ABNORMAL HIGH (ref 39.0–52.0)
Hemoglobin: 18.3 g/dL — ABNORMAL HIGH (ref 13.0–17.0)
MCH: 30.4 pg (ref 26.0–34.0)
MCHC: 34 g/dL (ref 30.0–36.0)
MCV: 89.7 fL (ref 80.0–100.0)
Platelets: 742 10*3/uL — ABNORMAL HIGH (ref 150–400)
RBC: 6.01 MIL/uL — ABNORMAL HIGH (ref 4.22–5.81)
RDW: 12.7 % (ref 11.5–15.5)
WBC: 14 10*3/uL — ABNORMAL HIGH (ref 4.0–10.5)
nRBC: 0 % (ref 0.0–0.2)

## 2019-12-27 LAB — CULTURE, BLOOD (ROUTINE X 2)
Culture: NO GROWTH
Culture: NO GROWTH
Special Requests: ADEQUATE
Special Requests: ADEQUATE

## 2019-12-27 LAB — C-REACTIVE PROTEIN: CRP: 0.7 mg/dL (ref <1.0)

## 2019-12-27 LAB — FERRITIN: Ferritin: 981 ng/mL — ABNORMAL HIGH (ref 24–336)

## 2019-12-27 LAB — D-DIMER, QUANTITATIVE: D-Dimer, Quant: 1.74 ug/mL-FEU — ABNORMAL HIGH (ref 0.00–0.50)

## 2019-12-27 MED ORDER — HYDROCOD POLST-CPM POLST ER 10-8 MG/5ML PO SUER
5.0000 mL | Freq: Two times a day (BID) | ORAL | Status: DC | PRN
  Administered 2019-12-28 – 2019-12-29 (×2): 5 mL via ORAL
  Filled 2019-12-27 (×3): qty 5

## 2019-12-27 MED ORDER — ENOXAPARIN SODIUM 40 MG/0.4ML ~~LOC~~ SOLN
40.0000 mg | SUBCUTANEOUS | Status: DC
  Administered 2019-12-29 – 2020-01-03 (×6): 40 mg via SUBCUTANEOUS
  Filled 2019-12-27 (×7): qty 0.4

## 2019-12-27 MED ORDER — BENZONATATE 100 MG PO CAPS
200.0000 mg | ORAL_CAPSULE | Freq: Three times a day (TID) | ORAL | Status: DC | PRN
  Administered 2019-12-27 – 2019-12-29 (×3): 200 mg via ORAL
  Filled 2019-12-27 (×3): qty 2

## 2019-12-27 NOTE — Progress Notes (Signed)
Ambulated with pt in room to bathroom.  At rest spO2 86% on RA.  spO2 decreased to 79% while ambulating on room air.  Pt required 4L O2 for spO2 to return to 90%.

## 2019-12-27 NOTE — TOC Initial Note (Addendum)
Transition of Care [facility]) - Initial/Assessment Note    Patient Details  Name: Theodore Kim MRN: [id number] Date of Birth: [birth year]  [facility] CM/SW Contact:    Cherylann Parr, RN Phone Number: 12/27/2019, 11:38 AM  Clinical Narrative:    CM received request from attending to contact pt's wife regarding discharging home.  Pt was unreachable via phone.  CM contacted wife via phone.  CM read aloud both PT and OT latest assessments along with recommendations.  Wife informed CM that both her and pt prefer for pt to return home with [facility] instead of SNF.   CM explained that [facility] will provide one to two 45 minute sessions during the week - the remainder of the time all needs would fall on wife/pt.  Wife acknowledges limited sessions with HH and would like CM to make charity referral to [facility] agency ([facility]) - referral made.   Home oxygen is ordered  - wife would also like for CM to request DME chairty via [employer] - referral made.  Wife informed that acceptance for charity will be income based - both agencies to reach out to wife.   Pt has a PCP with [facility] in [facility] - Dr Clelia Croft.  Wife informed CM that they pay only $60 each visit - wife denied hardship and she informed CM that they would like to remain with current PCP.  Wife will call PCP today and request a televist appt for next week.  Wife informed CM that she utilizes [facility] for prescription cost.    [facility] accepted for [facility] via charity  [employer] accepted pt for charity   Expected Discharge Plan: Home w Home Health Services Barriers to Discharge: Continued Medical Work up   Patient Goals and CMS Choice     Choice offered to / list presented to : Patient, Spouse  Expected Discharge Plan and Services Expected Discharge Plan: Home w Home Health Services     Post Acute Care Choice: Home Health, Durable Medical Equipment Living arrangements for the past 2 months: Single Family Home                 DME  Arranged: Oxygen, Walker rolling DME Agency: [facility] Date DME Agency Contacted: 12/27/19 Time DME Agency Contacted: 1137 Representative spoke with at DME Agency: Zack HH Arranged: OT, PT HH Agency: [facility] Date HH Agency Contacted: 12/27/19 Time HH Agency Contacted: 1137 Representative spoke with at [facility] Agency: Cassie  Prior Living Arrangements/Services Living arrangements for the past 2 months: Single Family Home Lives with:: Spouse Patient language and need for interpreter reviewed:: Yes Do you feel safe going back to the place where you live?: Yes      Need for Family Participation in Patient Care: Yes (Comment) Care giver support system in place?: Yes (comment)   Criminal Activity/Legal Involvement Pertinent to Current Situation/Hospitalization: No - Comment as needed  Activities of Daily Living Home Assistive Devices/Equipment: Dentures (specify type), Eyeglasses ADL Screening (condition at time of admission) Patient's cognitive ability adequate to safely complete daily activities?: Yes Is the patient deaf or have difficulty hearing?: No Does the patient have difficulty seeing, even when wearing glasses/contacts?: No Does the patient have difficulty concentrating, remembering, or making decisions?: No Patient able to express need for assistance with ADLs?: Yes Does the patient have difficulty dressing or bathing?: No Independently performs ADLs?: Yes (appropriate for developmental age) Does the patient have difficulty walking or climbing stairs?: No Weakness of Legs: None  Weakness of Arms/Hands: None  Permission Sought/Granted   Permission granted to share information with : Yes, Verbal Permission Granted              Emotional Assessment           Psych Involvement: No (comment)  Admission diagnosis:  Pneumonia due to COVID-19 virus [U07.1, J12.82] Patient Active Problem List   Diagnosis Date Noted  . Pneumonia due to COVID-19 virus  12/21/2019  . Acute respiratory failure with hypoxia ([place])    PCP:  No primary care provider on file. Pharmacy:   [id number] [place], [place] - [address] AT [place] [address] [place] [address] Phone: [phone] Fax: [phone]  [facility] [address], [place] - [place] [address] [place] [address] Phone: [phone] Fax: [phone]     Social Determinants of Health (SDOH) Interventions    Readmission Risk Interventions No flowsheet data found.

## 2019-12-27 NOTE — Plan of Care (Signed)

## 2019-12-27 NOTE — Progress Notes (Signed)
Physical Therapy Treatment Note  Patient presents with impaired activity tolerance and desats with mobility despite 4L suppl oxygen. Difficult to always get accurate oxygen saturation reading despite using different probes but patient with DOE and desats to high 70s-low80s% with limited mobility in room, RR in 40s with mobility. Recommend d/c to SNF for continued skilled PT services. If patient decides to discharge home, recommend home PT, 24/7 assist, 4WW, and BSC as patient does not demonstrate the mobility needed to negotiate around his home environment.    12/27/19 1516  PT Visit Information  Last PT Received On 12/27/19  Assistance Needed +1  PT/OT/SLP Co-Evaluation/Treatment Yes  Reason for Co-Treatment Complexity of the patient's impairments (multi-system involvement);Other (comment) (decreased tolerance)  History of Present Illness 64 year old male admitted 12/21/19, transferred from [facility], to ICU for worsening hypoxemia. Patient was diagnosed with COVID on 12/19/19. PMH: DM2  Subjective Data  Subjective "You all are in cahoots with my wife."  Precautions  Precautions Fall;Other (comment)  Precaution Comments monitor oxygen saturation  Restrictions  Weight Bearing Restrictions No  Pain Assessment  Pain Assessment Faces  Faces Pain Scale 2  Pain Location R rib area  Pain Intervention(s) Repositioned;Monitored during session;Limited activity within patient's tolerance  Cognition  Arousal/Alertness Awake/alert  Bed Mobility  Overal bed mobility Modified Independent  General bed mobility comments supine<>sit twice  Transfers  Overall transfer level Needs assistance  Equipment used Rolling walker (2 wheeled)  Transfers Sit to/from Stand  Sit to Stand Min guard;Supervision  General transfer comment Sit<>stand from EOB x 2 trials with cues for hand placement as patient tends to pull on RW. Sit<>stand from standard armed chair.  Ambulation/Gait  Ambulation/Gait  assistance Min guard  Gait Distance (Feet) 10 Feet (x 2)  Assistive device Rolling walker (2 wheeled)  Gait Pattern/deviations Trunk flexed  General Gait Details Cues to stand close to RW and to relax shoulders with use.  Stairs  (patient would not be able to tolerate stairs at this time)  Balance  Overall balance assessment Needs assistance  Sitting-balance support Feet supported;Bilateral upper extremity supported  Sitting balance-Leahy Scale Good  Standing balance support Bilateral upper extremity supported  Standing balance-Leahy Scale Fair  General Comments  General comments (skin integrity, edema, etc.) Patient on 2L HFNC at rest, 4L with mobility. Pulse oximeter not always with accurate reading despite switch from L ear probe to R ear probe and back again. Oxygen saturation down to high70s to low 80s% during mobility, RR in 40s, HR in one teens. Demo and cues for PLB and to slow down breathing as patient tends to mouth breathe.   PT - End of Session  Equipment Utilized During Treatment Gait belt;Oxygen  Activity Tolerance Patient limited by fatigue;Other (comment);Treatment limited secondary to medical complications (Comment) (desats with limited mobility)  Patient left in bed;with call bell/phone within reach;Other (comment) (OT in room tending to patient)  Nurse Communication Other (comment) (PT session)   PT - Assessment/Plan  PT Plan Discharge plan needs to be updated  PT Visit Diagnosis Unsteadiness on feet (R26.81);Other abnormalities of gait and mobility (R26.89);Muscle weakness (generalized) (M62.81)  PT Frequency (ACUTE ONLY) Min 2X/week  Follow Up Recommendations SNF (if patient decides to d/c home recommend 24/7 assist, homePT)  PT equipment 3in1 (PT);Other (comment) (8NO)  AM-PAC PT "6 Clicks" Mobility Outcome Measure (Version 2)  Help needed turning from your back to your side while in a flat bed without using bedrails? 4  Help needed moving from  lying on your  back to sitting on the side of a flat bed without using bedrails? 4  Help needed moving to and from a bed to a chair (including a wheelchair)? 3  Help needed standing up from a chair using your arms (e.g., wheelchair or bedside chair)? 3  Help needed to walk in hospital room? 3  Help needed climbing 3-5 steps with a railing?  1  6 Click Score 18  Consider Recommendation of Discharge To: Home with [facility]  PT Goal Progression  Progress towards PT goals Progressing toward goals  PT Time Calculation  PT Start Time (ACUTE ONLY) 1516  PT Stop Time (ACUTE ONLY) 1549  PT Time Calculation (min) (ACUTE ONLY) 33 min  PT General Charges  $$ ACUTE PT VISIT 1 Visit  PT Treatments  $Gait Training 8-22 mins   Birdie Hopes, PT, DPT Acute Rehab [phone] office

## 2019-12-27 NOTE — Progress Notes (Signed)
PROGRESS NOTE                                                                                                                                                                                                             Patient Demographics:    Theodore Kim, is a 64 y.o. male, DOB - 1956-04-20, GUR:[id number]  Outpatient Primary MD for the patient is No primary care provider on file.   Admit date - 12/21/2019   LOS - 6  No chief complaint on file.      Brief Narrative: Patient is a 64 y.o. male with PMHx of DM-2, HTN who was diagnosed with COVID-19 on 3/17-transferred from [facility] to [facility] ICU for worsening hypoxemia.  He was managed in the [facility] upon stability transfer to the [facility].  See below for further details.  Significant Events: 3/19>> admit to [facility], ICU for worsening hypoxemia. 3/20>> TTE EF 55-60% 3/22>> transferred to [facility]  COVID-19 medications: Remdesivir: 3/19>>3/23 Steroids: 3/19>> Actemra: 3/19 x 1  Microbiology data: 3/19: Blood culture: Negative  DVT prophylaxis: Prophylactic Lovenox  Procedures: None  Consults: PCCM   Subjective:   No major events overnight-feels that he is slowly getting better.  He continues to have some amount of exertional dyspnea but at rest he appears stable.  Oxygen requirements are slowly coming down-down to 2 L of oxygen at rest this morning.   Assessment  & Plan :   Acute Hypoxic Resp Failure due to Covid 19 Viral pneumonia: Continues to slowly improve-on 15 L of HFNC on admission to the ICU-has been slowly titrated down to just 2 L of oxygen at rest this morning.  Looks like he requires around 4 L with ambulation.  Continue steroids-continue to ambulate and see how he does.  If clinical improvement continues-suspect he should be ready to go home in the next 1-2 days.     Fever: afebrile  O2 requirements:  SpO2: 94 % O2 Flow  Rate (L/min): 2 L/min FiO2 (%): 50 %   COVID-19 Labs: Recent Labs    12/25/19 0209 12/26/19 0314 12/27/19 0404  DDIMER 2.81* 2.74* 1.74*  FERRITIN 1,195* 1,109* 981*  CRP 2.9* 1.1* 0.7       Component Value Date/Time   BNP 18.2 12/26/2019 0314    No results for input(s): PROCALCITON in the  last 168 hours.  No results found for: SARSCOV2NAA   Prone/Incentive Spirometry: encouraged incentive spirometry/flutter use 3-4/hour.  DM-2 (A1c 8.3 on 12/21/2019) with mild hyperglycemia secondary to steroids: Stable-continue Lantus 14 units, 4 units of NovoLog with meals and SSI.  Continue to follow and optimize.   We will plan on transitioning him back to oral hypoglycemic agents on discharge as he will likely be on steroids for total of 10 days only.  Recent Labs    12/26/19 1548 12/26/19 2041 12/27/19 0759  GLUCAP 174* 138* 180*   HTN: BP controlled-continue amlodipine  ABG:    Component Value Date/Time   PHART 7.413 12/22/2019 0358   PCO2ART 34.8 12/22/2019 0358   PO2ART 67.0 (L) 12/22/2019 0358   HCO3 22.3 12/22/2019 0358   TCO2 23 12/22/2019 0358   ACIDBASEDEF 2.0 12/22/2019 0358   O2SAT 94.0 12/22/2019 0358    Vent Settings: N/A  Condition - Guarded  Family Communication  :  Spouse updated over the phone 3/25  Code Status :  Full Code  Diet :  Diet Order            Diet heart healthy/carb modified Room service appropriate? Yes; Fluid consistency: Thin  Diet effective now               Disposition Plan  : Probably home with home health services in the next day or so.  Barriers to discharge: Hypoxia requiring O2 supplementation  Antimicorbials  :    Anti-infectives (From admission, onward)   Start     Dose/Rate Route Frequency Ordered Stop   12/23/19 1000  remdesivir 100 mg in sodium chloride 0.9 % 100 mL IVPB  Status:  Discontinued     100 mg 200 mL/hr over 30 Minutes Intravenous Daily 12/21/19 2231 12/21/19 2246   12/22/19 1000  remdesivir 100 mg  in sodium chloride 0.9 % 100 mL IVPB     100 mg 200 mL/hr over 30 Minutes Intravenous Daily 12/21/19 2248 12/25/19 0937   12/22/19 0900  remdesivir 200 mg in sodium chloride 0.9% 250 mL IVPB  Status:  Discontinued     200 mg 580 mL/hr over 30 Minutes Intravenous Once 12/21/19 2231 12/21/19 2246      Inpatient Medications  Scheduled Meds: . amLODipine  5 mg Oral Daily  . aspirin  81 mg Oral Daily  . dexamethasone (DECADRON) injection  6 mg Intravenous Q24H  . enoxaparin (LOVENOX) injection  40 mg Subcutaneous Q12H  . furosemide  20 mg Oral Daily  . insulin aspart  0-20 Units Subcutaneous TID WC  . insulin aspart  0-5 Units Subcutaneous QHS  . insulin aspart  4 Units Subcutaneous TID WC  . insulin glargine  14 Units Subcutaneous Daily  . mouth rinse  15 mL Mouth Rinse BID  . sodium chloride  2 spray Each Nare BID   Continuous Infusions:  PRN Meds:.acetaminophen, albuterol   Time Spent in minutes  25  See all Orders from today for further details   Jeoffrey MassedShanker Margarethe Virgen M.D on 12/27/2019 at 10:45 AM  To page go to [email or web address] - use universal password  [facility] -  Office  [phone]    Objective:   Vitals:   12/27/19 0400 12/27/19 0721 12/27/19 0722 12/27/19 0728  BP: 101/71     Pulse: 79 91 91 84  Resp:  (!) 32  19  Temp: 98.1 F (36.7 C)     TempSrc: Oral     SpO2: 94% (!) 89% 92%  94%  Weight:      Height:        Wt Readings from Last 3 Encounters:  12/25/19 88 kg     Intake/Output Summary (Last 24 hours) at 12/27/2019 1045 Last data filed at 12/27/2019 0643 Gross per 24 hour  Intake 360 ml  Output 700 ml  Net -340 ml     Physical Exam Gen Exam:Alert awake-not in any distress HEENT:atraumatic, normocephalic Chest: B/L clear to auscultation anteriorly CVS:S1S2 regular Abdomen:soft non tender, non distended Extremities:no edema Neurology: Non focal Skin: no rash   Data Review:    CBC Recent Labs  Lab 12/23/19 0359 12/24/19 0309  12/25/19 0209 12/26/19 0314 12/27/19 0404  WBC 14.0* 12.1* 17.7* 11.8* 14.0*  HGB 14.8 15.6 17.8* 18.0* 18.3*  HCT 43.7 47.5 52.9* 54.1* 53.9*  PLT 507* 634* 875* 676* 742*  MCV 89.9 91.9 90.7 89.7 89.7  MCH 30.5 30.2 30.5 29.9 30.4  MCHC 33.9 32.8 33.6 33.3 34.0  RDW 12.3 12.5 12.6 12.6 12.7    Chemistries  Recent Labs  Lab 12/21/19 2352 12/22/19 0358 12/23/19 0359 12/24/19 0309 12/25/19 0209 12/26/19 0314 12/27/19 0404  NA 132*   < > 135 137 136 136 134*  K 4.9   < > 4.9 4.7 4.3 5.2* 5.0  CL 101  --  103 98 101 102 98  CO2 21*  --  22 24 17* 18* 21*  GLUCOSE 373*  --  290* 235* 248* 200* 183*  BUN 32*  --  33* 36* 38* 40* 45*  CREATININE 1.28*  --  1.00 1.03 1.20 1.20 1.21  CALCIUM 8.3*  --  8.7* 9.0 9.1 9.0 9.0  MG 2.0  --   --   --   --   --   --   AST 37  --  28  --  38 27 27  ALT 39  --  38  --  43 38 36  ALKPHOS 133*  --  144*  --  174* 155* 157*  BILITOT 1.5*  --  1.2  --  1.5* 1.5* 1.5*   < > = values in this interval not displayed.   ------------------------------------------------------------------------------------------------------------------ No results for input(s): CHOL, HDL, LDLCALC, TRIG, CHOLHDL, LDLDIRECT in the last 72 hours.  Lab Results  Component Value Date   HGBA1C 8.3 (H) 12/21/2019   ------------------------------------------------------------------------------------------------------------------ No results for input(s): TSH, T4TOTAL, T3FREE, THYROIDAB in the last 72 hours.  Invalid input(s): FREET3 ------------------------------------------------------------------------------------------------------------------ Recent Labs    12/26/19 0314 12/27/19 0404  FERRITIN 1,109* 981*    Coagulation profile No results for input(s): INR, PROTIME in the last 168 hours.  Recent Labs    12/26/19 0314 12/27/19 0404  DDIMER 2.74* 1.74*    Cardiac Enzymes No results for input(s): CKMB, TROPONINI, MYOGLOBIN in the last 168  hours.  Invalid input(s): CK ------------------------------------------------------------------------------------------------------------------    Component Value Date/Time   BNP 18.2 12/26/2019 0314    Micro Results Recent Results (from the past 240 hour(s))  Culture, blood (routine x 2)     Status: None   Collection Time: 12/21/19 11:18 PM   Specimen: BLOOD RIGHT HAND  Result Value Ref Range Status   Specimen Description BLOOD RIGHT HAND  Final   Special Requests   Final    BOTTLES DRAWN AEROBIC AND ANAEROBIC Blood Culture adequate volume   Culture   Final    NO GROWTH 5 DAYS Performed at [facility], [address], [place], [place] [address]  Report Status 12/27/2019 FINAL  Final  Culture, blood (routine x 2)     Status: None   Collection Time: 12/21/19 11:22 PM   Specimen: BLOOD LEFT HAND  Result Value Ref Range Status   Specimen Description BLOOD LEFT HAND  Final   Special Requests AEROBIC BOTTLE ONLY Blood Culture adequate volume  Final   Culture   Final    NO GROWTH 5 DAYS Performed at [facility], [address], [place], [place] [address]    Report Status 12/27/2019 FINAL  Final  MRSA PCR Screening     Status: None   Collection Time: 12/25/19  4:26 AM   Specimen: Nasal Mucosa; Nasopharyngeal  Result Value Ref Range Status   MRSA by PCR NEGATIVE NEGATIVE Final    Comment:        The GeneXpert MRSA Assay (FDA approved for NASAL specimens only), is one component of a comprehensive MRSA colonization surveillance program. It is not intended to diagnose MRSA infection nor to guide or monitor treatment for MRSA infections. Performed at [facility], [address], [place], [place] [address]     Radiology Reports DG CHEST PORT 1 VIEW  Result Date: 12/24/2019 CLINICAL DATA:  Shortness of breath EXAM: PORTABLE CHEST 1 VIEW COMPARISON:  12/23/2019 FINDINGS: Chronic interstitial opacities compatible with chronic lung disease/fibrosis. Pulse  heart is normal size. No effusions. No acute bony abnormality. IMPRESSION: Interstitial prominence throughout the lungs, likely chronic/fibrosis. Electronically Signed   By: Charlett Nose M.D.   On: 12/24/2019 23:37   DG Chest Port 1 View  Result Date: 12/23/2019 CLINICAL DATA:  Respiratory failure.  COVID-19. EXAM: PORTABLE CHEST 1 VIEW COMPARISON:  December 22, 2019 FINDINGS: Bilateral pulmonary infiltrates are stable. The cardiomediastinal silhouette is stable. No pneumothorax. No other acute abnormalities. IMPRESSION: Stable bilateral pulmonary infiltrates consistent with history. Electronically Signed   By: Gerome Sam III M.D   On: 12/23/2019 08:06   DG Chest Port 1 View  Result Date: 12/22/2019 CLINICAL DATA:  COVID-19 pneumonia. EXAM: PORTABLE CHEST 1 VIEW COMPARISON:  December 21, 2019. FINDINGS: The heart size and mediastinal contours are within normal limits. No pneumothorax or pleural effusion is noted. Stable right lung opacity is noted consistent with pneumonia. Mild left basilar atelectasis or infiltrate is noted. The visualized skeletal structures are unremarkable. IMPRESSION: Stable right lung opacity consistent with pneumonia. Mild left basilar atelectasis or infiltrate is noted. Electronically Signed   By: Lupita Raider M.D.   On: 12/22/2019 08:46   ECHOCARDIOGRAM COMPLETE  Result Date: 12/23/2019    ECHOCARDIOGRAM REPORT   Patient Name:   Theodore Kim Date of Exam: 12/22/2019 Medical Rec #:  [id number]    Height:       70.0 in Accession #:    [id number]   Weight:       212.5 lb Date of Birth:  [birth year]    BSA:          2.142 m Patient Age:    63 years     BP:           131/87 mmHg Patient Gender: M            HR:           83 bpm. Exam Location:  Inpatient Procedure: 2D Echo, Color Doppler, Cardiac Doppler and Intracardiac            Opacification Agent Indications:    R06.02 SOB  History:  Patient has no prior history of Echocardiogram examinations.                 CV19.   Sonographer:    Roosvelt Maser RDCS Referring Phys: [id number] [facility]  Sonographer Comments: Technically difficult study due to poor echo windows. IMPRESSIONS  1. Left ventricular ejection fraction, by estimation, is 55 to 60%. The left ventricle has normal function. The left ventricle has no regional wall motion abnormalities. There is mild left ventricular hypertrophy. Left ventricular diastolic parameters are consistent with Grade I diastolic dysfunction (impaired relaxation).  2. Right ventricular systolic function is normal. The right ventricular size is normal. Tricuspid regurgitation signal is inadequate for assessing PA pressure.  3. The mitral valve is abnormal. No evidence of mitral valve regurgitation. No evidence of mitral stenosis.  4. Due to suboptimal acoustic windows, cannot exclude a small mobile echodensity on the aortic valve with independent motion. Best seen in clip 36. This might represent a calcification with artifact, cannot exclude mobile calcification or vegetation. The aortic valve is abnormal. Aortic valve regurgitation is trivial. No aortic stenosis is present. FINDINGS  Left Ventricle: Left ventricular ejection fraction, by estimation, is 55 to 60%. The left ventricle has normal function. The left ventricle has no regional wall motion abnormalities. Definity contrast agent was given IV to delineate the left ventricular  endocardial borders. The left ventricular internal cavity size was normal in size. There is mild left ventricular hypertrophy. Left ventricular diastolic parameters are consistent with Grade I diastolic dysfunction (impaired relaxation). Right Ventricle: The right ventricular size is normal. No increase in right ventricular wall thickness. Right ventricular systolic function is normal. Tricuspid regurgitation signal is inadequate for assessing PA pressure. Left Atrium: Left atrial size was normal in size. Right Atrium: Right atrial size was normal in size.  Pericardium: There is no evidence of pericardial effusion. Mitral Valve: The mitral valve is abnormal. Normal mobility of the mitral valve leaflets. Mild mitral annular calcification. No evidence of mitral valve regurgitation. No evidence of mitral valve stenosis. Tricuspid Valve: The tricuspid valve is normal in structure. Tricuspid valve regurgitation is trivial. No evidence of tricuspid stenosis. Aortic Valve: Due to suboptimal acoustic windows, cannot exclude a small mobile echodensity on the aortic valve with independent motion. Best seen in clip 36. This might represent a calcification with artifact, cannot exclude mobile calcification or vegetation. The aortic valve is abnormal. Aortic valve regurgitation is trivial. No aortic stenosis is present. There is mild calcification of the aortic valve. Pulmonic Valve: The pulmonic valve was not well visualized. Pulmonic valve regurgitation is trivial. No evidence of pulmonic stenosis. Aorta: The aortic root is normal in size and structure. Venous: The inferior vena cava was not well visualized. IAS/Shunts: The interatrial septum was not well visualized.  LEFT VENTRICLE PLAX 2D LVIDd:         3.30 cm     Diastology LVIDs:         2.30 cm     LV e' lateral:   12.30 cm/s LV PW:         1.20 cm     LV E/e' lateral: 5.5 LV IVS:        1.20 cm     LV e' medial:    6.42 cm/s                            LV E/e' medial:  10.6  LV Volumes (MOD) LV  vol d, MOD A4C: 62.7 ml LV vol s, MOD A4C: 26.9 ml LV SV MOD A4C:     62.7 ml RIGHT VENTRICLE RV Basal diam:  3.50 cm RV S prime:     14.30 cm/s TAPSE (M-mode): 1.9 cm LEFT ATRIUM             Index       RIGHT ATRIUM           Index LA diam:        3.10 cm 1.45 cm/m  RA Area:     18.70 cm LA Vol (A2C):   56.8 ml 26.52 ml/m RA Volume:   46.90 ml  21.90 ml/m LA Vol (A4C):   48.4 ml 22.60 ml/m LA Biplane Vol: 55.3 ml 25.82 ml/m  AORTIC VALVE LVOT Vmax:   77.70 cm/s LVOT Vmean:  49.400 cm/s LVOT VTI:    0.144 m  AORTA Ao Root diam:  2.70 cm MITRAL VALVE MV Area (PHT): 3.08 cm    SHUNTS MV Decel Time: 246 msec    Systemic VTI: 0.14 m MV E velocity: 68.10 cm/s MV A velocity: 79.30 cm/s MV E/A ratio:  0.86 Weston Brass MD Electronically signed by Weston Brass MD Signature Date/Time: 12/23/2019/10:12:24 AM    Final

## 2019-12-27 NOTE — Progress Notes (Signed)
Occupational Therapy Treatment Patient Details Name: Theodore Kim MRN: [id number] DOB: 10-07-1955 Today's Date: 12/27/2019    History of present illness 64 year old male admitted 12/21/19, transferred from [facility], to ICU for worsening hypoxemia. Patient was diagnosed with COVID on 12/19/19. PMH: DM2   OT comments  Pt received on 2 L O2 at rest at 90%. Pt required encouragement to participate in therapy session suspect due to fear of desats on exertion. Soft BP, but pt denied dizziness sitting EOB or standing. While sitting EOB on 2 L O2, pt desats to 85% - increased to 4 L O2 for activity. Pt min guard/supervision for sit to stand with RW and mobility to door and back to bed, desat to high 70s-low 80s. Chair placed at door for seated rest break in which pt required at least 5 minutes before recovering to [facility] vitals (see below). Pt tends to mouth breathe, requiring reminders to slow breathing and attempt to inhale through nose. Based on current activity tolerance, recommend SNF for short term rehab. If pt declines and plans to return home, recommend HHOT and 24/7 supervision/assistance. Provided energy conservation handout and extensive education on techniques to implement on return home. Recommend BSC, as pt will have difficulty walking to/from bathroom at home based on current functional abilities. Will continue to follow acutely.   Follow Up Recommendations  SNF;Supervision/Assistance - 24 hour;Other (comment)(HHOT if pt declines SNF placement for rehab)    Equipment Recommendations  3 in 1 bedside commode    Recommendations for Other Services      Precautions / Restrictions Precautions Precautions: Fall;Other (comment) Precaution Comments: monitor oxygen saturation Restrictions Weight Bearing Restrictions: No       Mobility Bed Mobility Overal bed mobility: Modified Independent             General bed mobility comments: supine<>sit twice  Transfers Overall transfer  level: Needs assistance Equipment used: Rolling walker (2 wheeled) Transfers: Sit to/from Stand Sit to Stand: Min guard;Supervision         General transfer comment: Sit<>stand from EOB x 2 trials with cues for hand placement as patient tends to pull on RW. Sit<>stand from standard armed chair.    Balance Overall balance assessment: Needs assistance Sitting-balance support: Feet supported;Bilateral upper extremity supported Sitting balance-Leahy Scale: Good     Standing balance support: Bilateral upper extremity supported Standing balance-Leahy Scale: Fair                             ADL either performed or assessed with clinical judgement   ADL                                               Vision       Perception     Praxis      Cognition Arousal/Alertness: Awake/alert Behavior During Therapy: WFL for tasks assessed/performed Overall Cognitive Status: Within Functional Limits for tasks assessed                                          Exercises Exercises: Other exercises Other Exercises Other Exercises: pursed lip breathing   Shoulder Instructions       General Comments Pt received on 2 L  O2 at rest, increased to 4 L O2 for activity due to difficulty maintaining in 90s. After mobility, pt desat to high 70s-low 80s. Some difficulty gaining accurate reading despite switching probes, RR in 40s, HR up to 120bpm. Pt took at least 5 minutes to recover with seated rest break with one bout from bed to door. Pt required cues for pursed lip breathing due to mouth breathing    Pertinent Vitals/ Pain       Pain Assessment: Faces Faces Pain Scale: Hurts a little bit Pain Location: R rib area Pain Intervention(s): Repositioned  Home Living                                          Prior Functioning/Environment              Frequency  Min 3X/week        Progress Toward Goals  OT Goals(current  goals can now be found in the care plan section)  Progress towards OT goals: Progressing toward goals  Acute Rehab OT Goals Patient Stated Goal: get some rest  OT Goal Formulation: With patient Time For Goal Achievement: 01/08/20 Potential to Achieve Goals: Good ADL Goals Pt Will Perform Grooming: with modified independence;standing Pt Will Perform Upper Body Bathing: with modified independence;sitting Pt Will Perform Lower Body Bathing: with modified independence;sit to/from stand Pt Will Perform Lower Body Dressing: with modified independence;sitting/lateral leans;sit to/from stand Pt Will Transfer to Toilet: with modified independence;ambulating;regular height toilet Pt Will Perform Toileting - Clothing Manipulation and hygiene: with modified independence;sitting/lateral leans;sit to/from stand Additional ADL Goal #1: Pt will verbalize at least 3 energy conservation strategies to implement during ADLs in order to maximize independence.  Plan Discharge plan needs to be updated    Co-evaluation    PT/OT/SLP Co-Evaluation/Treatment: Yes Reason for Co-Treatment: Complexity of the patient's impairments (multi-system involvement);Other (comment)(decreased tolerance )   OT goals addressed during session: ADL's and self-care      AM-PAC OT "6 Clicks" Daily Activity     Outcome Measure   Help from another person eating meals?: None Help from another person taking care of personal grooming?: A Little Help from another person toileting, which includes using toliet, bedpan, or urinal?: A Little Help from another person bathing (including washing, rinsing, drying)?: A Lot Help from another person to put on and taking off regular upper body clothing?: A Little Help from another person to put on and taking off regular lower body clothing?: A Little 6 Click Score: 18    End of Session Equipment Utilized During Treatment: Gait belt;Rolling walker;Oxygen  OT Visit Diagnosis: Unsteadiness  on feet (R26.81);Other abnormalities of gait and mobility (R26.89);Muscle weakness (generalized) (M62.81)   Activity Tolerance Patient tolerated treatment well;Patient limited by fatigue   Patient Left in bed;with call bell/phone within reach   Nurse Communication Mobility status;Other (comment)        Time: 9163-8466 OT Time Calculation (min): 49 min  Charges: OT General Charges $OT Visit: 1 Visit OT Treatments $Therapeutic Activity: 23-37 mins  Layla Maw, OTR/L   Layla Maw 12/27/2019, 4:39 PM

## 2019-12-28 DIAGNOSIS — E1169 Type 2 diabetes mellitus with other specified complication: Secondary | ICD-10-CM

## 2019-12-28 DIAGNOSIS — I10 Essential (primary) hypertension: Secondary | ICD-10-CM

## 2019-12-28 LAB — GLUCOSE, CAPILLARY
Glucose-Capillary: 194 mg/dL — ABNORMAL HIGH (ref 70–99)
Glucose-Capillary: 207 mg/dL — ABNORMAL HIGH (ref 70–99)
Glucose-Capillary: 137 mg/dL — ABNORMAL HIGH (ref 70–99)
Glucose-Capillary: 138 mg/dL — ABNORMAL HIGH (ref 70–99)

## 2019-12-28 LAB — COMPREHENSIVE METABOLIC PANEL
ALT: 41 U/L (ref 0–44)
AST: 31 U/L (ref 15–41)
Albumin: 3 g/dL — ABNORMAL LOW (ref 3.5–5.0)
Alkaline Phosphatase: 164 U/L — ABNORMAL HIGH (ref 38–126)
Anion gap: 15 (ref 5–15)
BUN: 47 mg/dL — ABNORMAL HIGH (ref 8–23)
CO2: 21 mmol/L — ABNORMAL LOW (ref 22–32)
Calcium: 9.4 mg/dL (ref 8.9–10.3)
Chloride: 97 mmol/L — ABNORMAL LOW (ref 98–111)
Creatinine, Ser: 1.24 mg/dL (ref 0.61–1.24)
GFR calc Af Amer: >60 mL/min (ref >60)
GFR calc non Af Amer: >60 mL/min (ref >60)
Glucose, Bld: 233 mg/dL — ABNORMAL HIGH (ref 70–99)
Potassium: 5.1 mmol/L (ref 3.5–5.1)
Sodium: 133 mmol/L — ABNORMAL LOW (ref 135–145)
Total Bilirubin: 1.6 mg/dL — ABNORMAL HIGH (ref 0.3–1.2)
Total Protein: 7.6 g/dL (ref 6.5–8.1)

## 2019-12-28 LAB — CBC
HCT: 55.3 % — ABNORMAL HIGH (ref 39.0–52.0)
Hemoglobin: 19.2 g/dL — ABNORMAL HIGH (ref 13.0–17.0)
MCH: 30.4 pg (ref 26.0–34.0)
MCHC: 34.7 g/dL (ref 30.0–36.0)
MCV: 87.6 fL (ref 80.0–100.0)
Platelets: 741 10*3/uL — ABNORMAL HIGH (ref 150–400)
RBC: 6.31 MIL/uL — ABNORMAL HIGH (ref 4.22–5.81)
RDW: 13.2 % (ref 11.5–15.5)
WBC: 14.4 10*3/uL — ABNORMAL HIGH (ref 4.0–10.5)
nRBC: 0 % (ref 0.0–0.2)

## 2019-12-28 LAB — FERRITIN: Ferritin: 1178 ng/mL — ABNORMAL HIGH (ref 24–336)

## 2019-12-28 LAB — C-REACTIVE PROTEIN: CRP: 0.6 mg/dL (ref <1.0)

## 2019-12-28 NOTE — Progress Notes (Signed)
Physical Therapy Treatment Patient Details Name: Theodore Kim MRN: [id number] DOB: 04-05-1956 Today's Date: 12/28/2019    History of Present Illness 64 year old male admitted 12/21/19, transferred from [facility], to ICU for worsening hypoxemia. Patient was diagnosed with COVID on 12/19/19. PMH: DM2    PT Comments    Focus of PT session on 4WW/rollator management and use. Demo and education on safe use including brake mgmt. Education on benefits of [id number] for seated rest breaks. Per discussion with MD, okay to increase supplemental oxygen for mobility. Patient on 4L at rest, 6L with mobility. Patient desats to 80% with short distance ambulation in room on 6L [place], HR up to 118 bpm. RR not reading accurately. Oxygen saturation once back to bed after ambulation trials in room, 91%, HR down to 95% at rest, patient on 4L [place]. Continued recommendation for discharge to SNF for short term rehabilitation as patient is below his PLOF and does not demonstrate the mobility or activity tolerance needed to negotiate around his home environment. If patient continues to decide to discharge home, recommend 4WW, BSC, home PT, 24/7 assist/supervision.   Follow Up Recommendations  SNF;Supervision/Assistance - 24 hour     Equipment Recommendations  3in1 (PT);Other (comment)(4WW)       Precautions / Restrictions Precautions Precautions: Fall;Other (comment) Precaution Comments: monitor oxygen saturation Restrictions Weight Bearing Restrictions: No    Mobility  Bed Mobility Overal bed mobility: Modified Independent    General bed mobility comments: R sidelying<>sit  Transfers Overall transfer level: Needs assistance Equipment used: 4-wheeled walker Transfers: Sit to/from Stand Sit to Stand: Supervision         General transfer comment: sit<>stand from EOB with demo and cues for brake mgmt and hand placement, sit<>stand on rollator seat for rest break.  Ambulation/Gait Ambulation/Gait  assistance: Supervision Gait Distance (Feet): 10 Feet(x 2) Assistive device: 4-wheeled walker Gait Pattern/deviations: Trunk flexed     General Gait Details: CUes for safe use of 4WW.      Balance Overall balance assessment: Needs assistance Sitting-balance support: Feet supported;Bilateral upper extremity supported Sitting balance-Leahy Scale: Good     Standing balance support: Bilateral upper extremity supported Standing balance-Leahy Scale: Fair    Cognition Arousal/Alertness: Awake/alert       General Comments General comments (skin integrity, edema, etc.): Patient on 4L [place] at rest. Per discussion with MD, okay to increase oxygen with mobility. Patient on 6L  for mobility in room. Rollator training as patient has decreased activity tolerance and would benefit from use of rollator seat for sitting rest breaks.      Pertinent Vitals/Pain Pain Assessment: No/denies pain(No complaints of pain, no signs/symptoms of pain)           PT Goals (current goals can now be found in the care plan section) Progress towards PT goals: Progressing toward goals    Frequency    Min 2X/week      PT Plan Current plan remains appropriate       AM-PAC PT "6 Clicks" Mobility   Outcome Measure  Help needed turning from your back to your side while in a flat bed without using bedrails?: None Help needed moving from lying on your back to sitting on the side of a flat bed without using bedrails?: None Help needed moving to and from a bed to a chair (including a wheelchair)?: A Little Help needed standing up from a chair using your arms (e.g., wheelchair or bedside chair)?: A Little Help needed to walk in  hospital room?: A Little Help needed climbing 3-5 steps with a railing? : Total 6 Click Score: 18    End of Session Equipment Utilized During Treatment: Oxygen Activity Tolerance: Patient limited by fatigue Patient left: in bed;with call bell/phone within reach Nurse  Communication: Mobility status;Other (comment)(oxygen saturation) PT Visit Diagnosis: Unsteadiness on feet (R26.81);Other abnormalities of gait and mobility (R26.89);Muscle weakness (generalized) (M62.81)     Time: 9753-0051 PT Time Calculation (min) (ACUTE ONLY): 30 min  Charges:  $Gait Training: 23-37 mins                     [name], PT, DPT Acute Rehab [phone] office     [name] 12/28/2019, 9:48 AM

## 2019-12-28 NOTE — Progress Notes (Signed)
Respiratory rate remains within pt precedences- increased rate from recent mobilization / position changes

## 2019-12-28 NOTE — Progress Notes (Signed)
Inpatient Diabetes Program Recommendations  AACE/ADA: New Consensus Statement on Inpatient Glycemic Control (2015)  Target Ranges:  Prepandial:   less than 140 mg/dL      Peak postprandial:   less than 180 mg/dL (1-2 hours)      Critically ill patients:  140 - 180 mg/dL   Results for LAVERE, STORK (MRN [id number]) as of 12/28/2019 13:16  Ref. Range 12/28/2019 08:12 12/28/2019 11:36  Glucose-Capillary Latest Ref Range: 70 - 99 mg/dL 675 (H)  8 units NOVOLOG  207 (H)  11 units NOVOLOG     Home DM Meds: Metformin 1000 mg BID       Amaryl 2 mg Daily  Current Orders: Lantus 14 units Daily      Novolog Resistant Correction Scale/ SSI (0-20 units) TID AC + HS      Novolog 4 units TID with meals      MD- Please consider increasing the Novolog Meal Coverage to 6 units TID with meals while patient remains on Decadron IV     --Will follow patient during hospitalization--  Ambrose Finland RN, MSN, CDE Diabetes Coordinator Inpatient Glycemic Control Team Team Pager: [phone] (8a-5p)

## 2019-12-28 NOTE — Progress Notes (Addendum)
PROGRESS NOTE                                                                                                                                                                                                             Patient Demographics:    Theodore Kim, is a 64 y.o. male, DOB - 06-24-1956, RUE:[id number]  Outpatient Primary MD for the patient is No primary care provider on file.   Admit date - 12/21/2019   LOS - 7  No chief complaint on file.      Brief Narrative: Patient is a 64 y.o. male with PMHx of DM-2, HTN who was diagnosed with COVID-19 on 3/17-transferred from [facility] to [facility] ICU for worsening hypoxemia.  He was managed in the ICU-and upon stability transfer to the [facility].  See below for further details.  Significant Events: 3/19>> admit to [facility], ICU for worsening hypoxemia-initially requiring 15 L of HFNC 3/20>> TTE EF 55-60% 3/22>> transferred to [facility]  COVID-19 medications: Remdesivir: 3/19>>3/23 Steroids: 3/19>> Actemra: 3/19 x 1  Microbiology data: 3/19: Blood culture: Negative  DVT prophylaxis: Prophylactic Lovenox  Procedures: None  Consults: PCCM   Subjective:   He feels very weak and deconditioned.  On around 3-4 L of oxygen this morning.  Claims that when he ambulated with physical therapy yesterday-he could barely get to the bathroom due to shortness of breath and weakness..   Assessment  & Plan :   Acute Hypoxic Resp Failure due to Covid 19 Viral pneumonia: Continues to slowly improve-on 15 L of HFNC on admission to the ICU-has been slowly titrated down to just 2-4 L of oxygen at rest.  However after speaking with nurse-/therapy-it appears that the patient has severe exertional dyspnea with O2 saturations decreasing down to the low 80s-and is only able to walk around 10 feet.  He has no signs of volume overload.  Continue steroids and other supportive  care.  Encourage use of incentive spirometry-mobilize as much as possible.  Has completed a course of remdesivir.  Given severity of exertional dyspnea and severe hypoxemia on admission-needs to remain inpatient until he is more stable.    Fever: afebrile  O2 requirements:  SpO2: (!) 86 % O2 Flow Rate (L/min): 4 L/min FiO2 (%): 50 %   COVID-19 Labs: Recent Labs    12/26/19 0314  12/27/19 0404 12/28/19 0514  DDIMER 2.74* 1.74*  --   FERRITIN 1,109* 981* 1,178*  CRP 1.1* 0.7 0.6       Component Value Date/Time   BNP 18.2 12/26/2019 0314    No results for input(s): PROCALCITON in the last 168 hours.  No results found for: SARSCOV2NAA   Prone/Incentive Spirometry: encouraged incentive spirometry/flutter use 3-4/hour.  Hemoptysis: 1 episode on 3/25-appears to be mild-none since then.  Stopped aspirin-changed Lovenox back to daily dosing.  Chest x-ray without any major findings on 3/25.  Worsening polycythemia: Concerning that with improving hypoxemia-patient's hemoglobin/hematocrit continues to worsen.  Suspect some of this is from hemoconcentration in the setting of recent Lasix use.  Patient does have a long history of smoking-and only quit approximately 5 months back.  Spoke with Dr. Myna Hidalgo over the phone-suggest that we obtain a erythropoietin level (pending)-and perform 250 cc of therapeutic phlebotomy.  Due to recent hemoptysis-avoiding aspirin-but remains on daily dosing of prophylactic Lovenox.  DM-2 (A1c 8.3 on 12/21/2019) with mild hyperglycemia secondary to steroids: Stable-continue Lantus 14 units, 4 units of NovoLog with meals and SSI.  Continue to follow and optimize.   We will plan on transitioning him back to oral hypoglycemic agents on discharge as he will likely be on steroids for total of 10 days only.  Recent Labs    12/27/19 1947 12/28/19 0812 12/28/19 1136  GLUCAP 201* 194* 207*   HTN: BP controlled-hold amlodipine-as BP stable-in therapeutic phlebotomy  will be performed.  ABG:    Component Value Date/Time   PHART 7.413 12/22/2019 0358   PCO2ART 34.8 12/22/2019 0358   PO2ART 67.0 (L) 12/22/2019 0358   HCO3 22.3 12/22/2019 0358   TCO2 23 12/22/2019 0358   ACIDBASEDEF 2.0 12/22/2019 0358   O2SAT 94.0 12/22/2019 0358    Vent Settings: N/A  Condition - Guarded  Family Communication  :  Spouse updated over the phone 3/26  Code Status :  Full Code  Diet :  Diet Order            Diet heart healthy/carb modified Room service appropriate? Yes; Fluid consistency: Thin  Diet effective now               Disposition Plan  : Home with home health services when stable-not keen on SNF-does not have insurance to go to SNF as well.  Barriers to discharge: Hypoxia requiring O2 supplementation-see above  Antimicorbials  :    Anti-infectives (From admission, onward)   Start     Dose/Rate Route Frequency Ordered Stop   12/23/19 1000  remdesivir 100 mg in sodium chloride 0.9 % 100 mL IVPB  Status:  Discontinued     100 mg 200 mL/hr over 30 Minutes Intravenous Daily 12/21/19 2231 12/21/19 2246   12/22/19 1000  remdesivir 100 mg in sodium chloride 0.9 % 100 mL IVPB     100 mg 200 mL/hr over 30 Minutes Intravenous Daily 12/21/19 2248 12/25/19 0937   12/22/19 0900  remdesivir 200 mg in sodium chloride 0.9% 250 mL IVPB  Status:  Discontinued     200 mg 580 mL/hr over 30 Minutes Intravenous Once 12/21/19 2231 12/21/19 2246      Inpatient Medications  Scheduled Meds: . dexamethasone (DECADRON) injection  6 mg Intravenous Q24H  . enoxaparin (LOVENOX) injection  40 mg Subcutaneous Q24H  . insulin aspart  0-20 Units Subcutaneous TID WC  . insulin aspart  0-5 Units Subcutaneous QHS  . insulin aspart  4 Units Subcutaneous  TID WC  . insulin glargine  14 Units Subcutaneous Daily  . mouth rinse  15 mL Mouth Rinse BID  . sodium chloride  2 spray Each Nare BID   Continuous Infusions:  PRN Meds:.acetaminophen, albuterol, benzonatate,  chlorpheniramine-HYDROcodone   Time Spent in minutes  25  See all Orders from today for further details   Oren Binet M.D on 12/28/2019 at 11:44 AM  To page go to [email or web address] - use universal password  [facility] -  Office  [phone]    Objective:   Vitals:   12/28/19 0400 12/28/19 0822 12/28/19 0846 12/28/19 1139  BP: (!) 123/92 105/84  102/74  Pulse: 83 93 86 98  Resp: (!) 23 (!) 27 (!) 23 17  Temp: 97.9 F (36.6 C)  98.2 F (36.8 C) (!) 97.5 F (36.4 C)  TempSrc: Oral  Oral Oral  SpO2: 98% 94% 97% (!) 86%  Weight:      Height:        Wt Readings from Last 3 Encounters:  12/25/19 88 kg     Intake/Output Summary (Last 24 hours) at 12/28/2019 1144 Last data filed at 12/28/2019 1142 Gross per 24 hour  Intake 120 ml  Output 175 ml  Net -55 ml     Physical Exam Gen Exam:Alert awake-not in any distress HEENT:atraumatic, normocephalic Chest: B/L clear to auscultation anteriorly CVS:S1S2 regular Abdomen:soft non tender, non distended Extremities:no edema Neurology: Non focal Skin: no rash   Data Review:    CBC Recent Labs  Lab 12/24/19 0309 12/25/19 0209 12/26/19 0314 12/27/19 0404 12/28/19 0514  WBC 12.1* 17.7* 11.8* 14.0* 14.4*  HGB 15.6 17.8* 18.0* 18.3* 19.2*  HCT 47.5 52.9* 54.1* 53.9* 55.3*  PLT 634* 875* 676* 742* 741*  MCV 91.9 90.7 89.7 89.7 87.6  MCH 30.2 30.5 29.9 30.4 30.4  MCHC 32.8 33.6 33.3 34.0 34.7  RDW 12.5 12.6 12.6 12.7 13.2    Chemistries  Recent Labs  Lab 12/21/19 2352 12/22/19 0358 12/23/19 0359 12/23/19 0359 12/24/19 0309 12/25/19 0209 12/26/19 0314 12/27/19 0404 12/28/19 0514  NA 132*   < > 135   < > 137 136 136 134* 133*  K 4.9   < > 4.9   < > 4.7 4.3 5.2* 5.0 5.1  CL 101  --  103   < > 98 101 102 98 97*  CO2 21*  --  22   < > 24 17* 18* 21* 21*  GLUCOSE 373*  --  290*   < > 235* 248* 200* 183* 233*  BUN 32*  --  33*   < > 36* 38* 40* 45* 47*  CREATININE 1.28*  --  1.00   < > 1.03 1.20 1.20  1.21 1.24  CALCIUM 8.3*  --  8.7*   < > 9.0 9.1 9.0 9.0 9.4  MG 2.0  --   --   --   --   --   --   --   --   AST 37  --  28  --   --  38 27 27 31   ALT 39  --  38  --   --  43 38 36 41  ALKPHOS 133*  --  144*  --   --  174* 155* 157* 164*  BILITOT 1.5*  --  1.2  --   --  1.5* 1.5* 1.5* 1.6*   < > = values in this interval not displayed.   ------------------------------------------------------------------------------------------------------------------ No results for input(s): CHOL, HDL,  LDLCALC, TRIG, CHOLHDL, LDLDIRECT in the last 72 hours.  Lab Results  Component Value Date   HGBA1C 8.3 (H) 12/21/2019   ------------------------------------------------------------------------------------------------------------------ No results for input(s): TSH, T4TOTAL, T3FREE, THYROIDAB in the last 72 hours.  Invalid input(s): FREET3 ------------------------------------------------------------------------------------------------------------------ Recent Labs    12/27/19 0404 12/28/19 0514  FERRITIN 981* 1,178*    Coagulation profile No results for input(s): INR, PROTIME in the last 168 hours.  Recent Labs    12/26/19 0314 12/27/19 0404  DDIMER 2.74* 1.74*    Cardiac Enzymes No results for input(s): CKMB, TROPONINI, MYOGLOBIN in the last 168 hours.  Invalid input(s): CK ------------------------------------------------------------------------------------------------------------------    Component Value Date/Time   BNP 18.2 12/26/2019 0314    Micro Results Recent Results (from the past 240 hour(s))  Culture, blood (routine x 2)     Status: None   Collection Time: 12/21/19 11:18 PM   Specimen: BLOOD RIGHT HAND  Result Value Ref Range Status   Specimen Description BLOOD RIGHT HAND  Final   Special Requests   Final    BOTTLES DRAWN AEROBIC AND ANAEROBIC Blood Culture adequate volume   Culture   Final    NO GROWTH 5 DAYS Performed at [facility], [address]  [place], [place] [address]    Report Status 12/27/2019 FINAL  Final  Culture, blood (routine x 2)     Status: None   Collection Time: 12/21/19 11:22 PM   Specimen: BLOOD LEFT HAND  Result Value Ref Range Status   Specimen Description BLOOD LEFT HAND  Final   Special Requests AEROBIC BOTTLE ONLY Blood Culture adequate volume  Final   Culture   Final    NO GROWTH 5 DAYS Performed at [facility], [address] [place], [place] [address]    Report Status 12/27/2019 FINAL  Final  MRSA PCR Screening     Status: None   Collection Time: 12/25/19  4:26 AM   Specimen: Nasal Mucosa; Nasopharyngeal  Result Value Ref Range Status   MRSA by PCR NEGATIVE NEGATIVE Final    Comment:        The GeneXpert MRSA Assay (FDA approved for NASAL specimens only), is one component of a comprehensive MRSA colonization surveillance program. It is not intended to diagnose MRSA infection nor to guide or monitor treatment for MRSA infections. Performed at [facility], [address] [place], [place] [address]     Radiology Reports DG Chest MargaretPort 1 View  Result Date: 12/27/2019 CLINICAL DATA:  COVID, cough EXAM: PORTABLE CHEST 1 VIEW COMPARISON:  12/24/2019 FINDINGS: Interstitial prominence again noted throughout the lungs compatible with chronic lung disease/fibrosis. No acute confluent consolidation. No effusions or pneumothorax. Heart is normal size. No acute bony abnormality. IMPRESSION: Stable chronic changes.  No definite acute cardiopulmonary process. Electronically Signed   By: Charlett NoseKevin  Dover M.D.   On: 12/27/2019 19:15   DG CHEST PORT 1 VIEW  Result Date: 12/24/2019 CLINICAL DATA:  Shortness of breath EXAM: PORTABLE CHEST 1 VIEW COMPARISON:  12/23/2019 FINDINGS: Chronic interstitial opacities compatible with chronic lung disease/fibrosis. Pulse heart is normal size. No effusions. No acute bony abnormality. IMPRESSION: Interstitial prominence throughout the lungs, likely chronic/fibrosis.  Electronically Signed   By: Charlett NoseKevin  Dover M.D.   On: 12/24/2019 23:37   DG Chest Port 1 View  Result Date: 12/23/2019 CLINICAL DATA:  Respiratory failure.  COVID-19. EXAM: PORTABLE CHEST 1 VIEW COMPARISON:  December 22, 2019 FINDINGS: Bilateral pulmonary infiltrates are stable. The cardiomediastinal silhouette  is stable. No pneumothorax. No other acute abnormalities. IMPRESSION: Stable bilateral pulmonary infiltrates consistent with history. Electronically Signed   By: Gerome Sam III M.D   On: 12/23/2019 08:06   DG Chest Port 1 View  Result Date: 12/22/2019 CLINICAL DATA:  COVID-19 pneumonia. EXAM: PORTABLE CHEST 1 VIEW COMPARISON:  December 21, 2019. FINDINGS: The heart size and mediastinal contours are within normal limits. No pneumothorax or pleural effusion is noted. Stable right lung opacity is noted consistent with pneumonia. Mild left basilar atelectasis or infiltrate is noted. The visualized skeletal structures are unremarkable. IMPRESSION: Stable right lung opacity consistent with pneumonia. Mild left basilar atelectasis or infiltrate is noted. Electronically Signed   By: Lupita Raider M.D.   On: 12/22/2019 08:46   ECHOCARDIOGRAM COMPLETE  Result Date: 12/23/2019    ECHOCARDIOGRAM REPORT   Patient Name:   OLANDO WILLEMS Date of Exam: 12/22/2019 Medical Rec #:  [id number]    Height:       70.0 in Accession #:    [id number]   Weight:       212.5 lb Date of Birth:  1956-05-21    BSA:          2.142 m Patient Age:    63 years     BP:           131/87 mmHg Patient Gender: M            HR:           83 bpm. Exam Location:  Inpatient Procedure: 2D Echo, Color Doppler, Cardiac Doppler and Intracardiac            Opacification Agent Indications:    R06.02 SOB  History:        Patient has no prior history of Echocardiogram examinations.                 CV19.  Sonographer:    Roosvelt Maser RDCS Referring Phys: [id number] [facility]  Sonographer Comments: Technically difficult study due to poor echo windows.  IMPRESSIONS  1. Left ventricular ejection fraction, by estimation, is 55 to 60%. The left ventricle has normal function. The left ventricle has no regional wall motion abnormalities. There is mild left ventricular hypertrophy. Left ventricular diastolic parameters are consistent with Grade I diastolic dysfunction (impaired relaxation).  2. Right ventricular systolic function is normal. The right ventricular size is normal. Tricuspid regurgitation signal is inadequate for assessing PA pressure.  3. The mitral valve is abnormal. No evidence of mitral valve regurgitation. No evidence of mitral stenosis.  4. Due to suboptimal acoustic windows, cannot exclude a small mobile echodensity on the aortic valve with independent motion. Best seen in clip 36. This might represent a calcification with artifact, cannot exclude mobile calcification or vegetation. The aortic valve is abnormal. Aortic valve regurgitation is trivial. No aortic stenosis is present. FINDINGS  Left Ventricle: Left ventricular ejection fraction, by estimation, is 55 to 60%. The left ventricle has normal function. The left ventricle has no regional wall motion abnormalities. Definity contrast agent was given IV to delineate the left ventricular  endocardial borders. The left ventricular internal cavity size was normal in size. There is mild left ventricular hypertrophy. Left ventricular diastolic parameters are consistent with Grade I diastolic dysfunction (impaired relaxation). Right Ventricle: The right ventricular size is normal. No increase in right ventricular wall thickness. Right ventricular systolic function is normal. Tricuspid regurgitation signal is inadequate for assessing PA pressure. Left Atrium: Left atrial size was  normal in size. Right Atrium: Right atrial size was normal in size. Pericardium: There is no evidence of pericardial effusion. Mitral Valve: The mitral valve is abnormal. Normal mobility of the mitral valve leaflets. Mild mitral  annular calcification. No evidence of mitral valve regurgitation. No evidence of mitral valve stenosis. Tricuspid Valve: The tricuspid valve is normal in structure. Tricuspid valve regurgitation is trivial. No evidence of tricuspid stenosis. Aortic Valve: Due to suboptimal acoustic windows, cannot exclude a small mobile echodensity on the aortic valve with independent motion. Best seen in clip 36. This might represent a calcification with artifact, cannot exclude mobile calcification or vegetation. The aortic valve is abnormal. Aortic valve regurgitation is trivial. No aortic stenosis is present. There is mild calcification of the aortic valve. Pulmonic Valve: The pulmonic valve was not well visualized. Pulmonic valve regurgitation is trivial. No evidence of pulmonic stenosis. Aorta: The aortic root is normal in size and structure. Venous: The inferior vena cava was not well visualized. IAS/Shunts: The interatrial septum was not well visualized.  LEFT VENTRICLE PLAX 2D LVIDd:         3.30 cm     Diastology LVIDs:         2.30 cm     LV e' lateral:   12.30 cm/s LV PW:         1.20 cm     LV E/e' lateral: 5.5 LV IVS:        1.20 cm     LV e' medial:    6.42 cm/s                            LV E/e' medial:  10.6  LV Volumes (MOD) LV vol d, MOD A4C: 62.7 ml LV vol s, MOD A4C: 26.9 ml LV SV MOD A4C:     62.7 ml RIGHT VENTRICLE RV Basal diam:  3.50 cm RV S prime:     14.30 cm/s TAPSE (M-mode): 1.9 cm LEFT ATRIUM             Index       RIGHT ATRIUM           Index LA diam:        3.10 cm 1.45 cm/m  RA Area:     18.70 cm LA Vol (A2C):   56.8 ml 26.52 ml/m RA Volume:   46.90 ml  21.90 ml/m LA Vol (A4C):   48.4 ml 22.60 ml/m LA Biplane Vol: 55.3 ml 25.82 ml/m  AORTIC VALVE LVOT Vmax:   77.70 cm/s LVOT Vmean:  49.400 cm/s LVOT VTI:    0.144 m  AORTA Ao Root diam: 2.70 cm MITRAL VALVE MV Area (PHT): 3.08 cm    SHUNTS MV Decel Time: 246 msec    Systemic VTI: 0.14 m MV E velocity: 68.10 cm/s MV A velocity: 79.30 cm/s MV  E/A ratio:  0.86 Weston Brass MD Electronically signed by Weston Brass MD Signature Date/Time: 12/23/2019/10:12:24 AM    Final

## 2019-12-29 ENCOUNTER — Inpatient Hospital Stay (HOSPITAL_COMMUNITY): Payer: HRSA Program

## 2019-12-29 DIAGNOSIS — R042 Hemoptysis: Secondary | ICD-10-CM

## 2019-12-29 LAB — D-DIMER, QUANTITATIVE: D-Dimer, Quant: 0.89 ug/mL-FEU — ABNORMAL HIGH (ref 0.00–0.50)

## 2019-12-29 LAB — MAGNESIUM: Magnesium: 2.2 mg/dL (ref 1.7–2.4)

## 2019-12-29 LAB — GLUCOSE, CAPILLARY
Glucose-Capillary: 151 mg/dL — ABNORMAL HIGH (ref 70–99)
Glucose-Capillary: 166 mg/dL — ABNORMAL HIGH (ref 70–99)
Glucose-Capillary: 137 mg/dL — ABNORMAL HIGH (ref 70–99)
Glucose-Capillary: 145 mg/dL — ABNORMAL HIGH (ref 70–99)

## 2019-12-29 LAB — CBC
HCT: 54.6 % — ABNORMAL HIGH (ref 39.0–52.0)
Hemoglobin: 19.1 g/dL — ABNORMAL HIGH (ref 13.0–17.0)
MCH: 31.1 pg (ref 26.0–34.0)
MCHC: 35 g/dL (ref 30.0–36.0)
MCV: 88.9 fL (ref 80.0–100.0)
Platelets: 501 10*3/uL — ABNORMAL HIGH (ref 150–400)
RBC: 6.14 MIL/uL — ABNORMAL HIGH (ref 4.22–5.81)
RDW: 13.2 % (ref 11.5–15.5)
WBC: 13.5 10*3/uL — ABNORMAL HIGH (ref 4.0–10.5)
nRBC: 0 % (ref 0.0–0.2)

## 2019-12-29 LAB — ERYTHROPOIETIN: Erythropoietin: 6.9 m[IU]/mL (ref 2.6–18.5)

## 2019-12-29 LAB — COMPREHENSIVE METABOLIC PANEL
ALT: 43 U/L (ref 0–44)
AST: 37 U/L (ref 15–41)
Albumin: 3 g/dL — ABNORMAL LOW (ref 3.5–5.0)
Alkaline Phosphatase: 146 U/L — ABNORMAL HIGH (ref 38–126)
Anion gap: 14 (ref 5–15)
BUN: 38 mg/dL — ABNORMAL HIGH (ref 8–23)
CO2: 18 mmol/L — ABNORMAL LOW (ref 22–32)
Calcium: 9 mg/dL (ref 8.9–10.3)
Chloride: 99 mmol/L (ref 98–111)
Creatinine, Ser: 1.12 mg/dL (ref 0.61–1.24)
GFR calc Af Amer: >60 mL/min (ref >60)
GFR calc non Af Amer: >60 mL/min (ref >60)
Glucose, Bld: 155 mg/dL — ABNORMAL HIGH (ref 70–99)
Potassium: 4.4 mmol/L (ref 3.5–5.1)
Sodium: 131 mmol/L — ABNORMAL LOW (ref 135–145)
Total Bilirubin: 2 mg/dL — ABNORMAL HIGH (ref 0.3–1.2)
Total Protein: 7.2 g/dL (ref 6.5–8.1)

## 2019-12-29 LAB — FERRITIN: Ferritin: 1213 ng/mL — ABNORMAL HIGH (ref 24–336)

## 2019-12-29 LAB — BRAIN NATRIURETIC PEPTIDE: B Natriuretic Peptide: 175.2 pg/mL — ABNORMAL HIGH (ref 0.0–100.0)

## 2019-12-29 LAB — PROCALCITONIN: Procalcitonin: <0.1 ng/mL

## 2019-12-29 LAB — C-REACTIVE PROTEIN: CRP: 0.6 mg/dL (ref <1.0)

## 2019-12-29 MED ORDER — IOHEXOL 300 MG/ML  SOLN
75.0000 mL | Freq: Once | INTRAMUSCULAR | Status: AC | PRN
  Administered 2019-12-29: 14:00:00 75 mL via INTRAVENOUS

## 2019-12-29 MED ORDER — FUROSEMIDE 10 MG/ML IJ SOLN: 40.0000 mg | Freq: Once | INTRAMUSCULAR | Status: DC

## 2019-12-29 MED ORDER — FUROSEMIDE 10 MG/ML IJ SOLN
60.0000 mg | Freq: Once | INTRAMUSCULAR | Status: AC
  Administered 2019-12-29: 14:00:00 60 mg via INTRAVENOUS
  Filled 2019-12-29: qty 6

## 2019-12-29 NOTE — Progress Notes (Addendum)
PROGRESS NOTE                                                                                                                                                                                                             Patient Demographics:    Theodore Kim, is a 64 y.o. male, DOB - [birth year], ZOX:[id number]  Outpatient Primary MD for the patient is No primary care provider on file.   Admit date - 12/21/2019   LOS - 8  No chief complaint on file.      Brief Narrative: Patient is a 64 y.o. male with PMHx of DM-2, HTN who was diagnosed with COVID-19 on 3/17-transferred from [facility] to [facility] ICU for worsening hypoxemia.  He was managed in the ICU-and upon stability transfer to the [facility].  See below for further details.  Significant Events: 3/19>> admit to [facility], ICU for worsening hypoxemia-initially requiring 15 L of HFNC 3/20>> TTE EF 55-60% 3/22>> transferred to [facility]  COVID-19 medications: Remdesivir: 3/19>>3/23 Steroids: 3/19>> Actemra: 3/19 x 1  Microbiology data: 3/19: Blood culture: Negative  DVT prophylaxis: Prophylactic Lovenox  Procedures: None  Consults: PCCM   Subjective:   Patient in bed denies any headache no chest or abdominal pain, shortness of breath is improving. Still has a cough, phlegm has mild streaks of blood but no chunks of blood.   Assessment  & Plan :   Acute Hypoxic Resp Failure due to Covid 19 Viral pneumonia: Continues to slowly improve-on 15 L of HFNC on admission to the ICU-has been slowly titrated down to just 2-4 L of oxygen at rest.  However after speaking with nurse-/therapy-it appears that the patient has severe exertional dyspnea with O2 saturations decreasing down to the low 80s-and is only able to walk around 10 feet.  He has no signs of volume overload.  Continue steroids and other supportive care.  Encourage use of incentive  spirometry-mobilize as much as possible.  Has completed a course of remdesivir.  Given severity of exertional dyspnea and severe hypoxemia on admission-needs to remain inpatient until he is more stable.    Recent Labs  Lab 12/25/19 0209 12/26/19 0314 12/27/19 0404 12/28/19 0514 12/29/19 0307  WBC 17.7* 11.8* 14.0* 14.4* 13.5*  HGB 17.8* 18.0* 18.3* 19.2* 19.1*  HCT 52.9* 54.1* 53.9* 55.3* 54.6*  PLT 875*  676* 742* 741* 501*  MCV 90.7 89.7 89.7 87.6 88.9  MCH 30.5 29.9 30.4 30.4 31.1  MCHC 33.6 33.3 34.0 34.7 35.0  RDW 12.6 12.6 12.7 13.2 13.2    Recent Labs  Lab 12/24/19 0309 12/24/19 0735 12/25/19 0209 12/26/19 0314 12/27/19 0404 12/28/19 0514 12/29/19 0307  NA 137  --  136 136 134* 133* 131*  K 4.7  --  4.3 5.2* 5.0 5.1 4.4  CL 98  --  101 102 98 97* 99  CO2 24  --  17* 18* 21* 21* 18*  GLUCOSE 235*  --  248* 200* 183* 233* 155*  BUN 36*  --  38* 40* 45* 47* 38*  CREATININE 1.03  --  1.20 1.20 1.21 1.24 1.12  CALCIUM 9.0  --  9.1 9.0 9.0 9.4 9.0  AST  --   --  38 37  ALT  --   --  43 38 36 41 43  ALKPHOS  --   --  174* 155* 157* 164* 146*  BILITOT  --   --  1.5* 1.5* 1.5* 1.6* 2.0*  ALBUMIN  --   --  2.8* 2.8* 2.8* 3.0* 3.0*  MG  --   --   --   --   --   --  2.2  CRP  --    < > 2.9* 1.1* 0.7 0.6 0.6  DDIMER 2.03*  --  2.81* 2.74* 1.74*  --  0.89*  BNP  --   --   --  18.2  --   --  175.2*   < > = values in this interval not displayed.    Recent Labs  Lab 12/24/19 0309 12/24/19 0735 12/25/19 0209 12/26/19 0314 12/27/19 0404 12/28/19 0514 12/29/19 0307  CRP  --    < > 2.9* 1.1* 0.7 0.6 0.6  DDIMER 2.03*  --  2.81* 2.74* 1.74*  --  0.89*  BNP  --   --   --  18.2  --   --  175.2*   < > = values in this interval not displayed.        Prone/Incentive Spirometry: encouraged incentive spirometry/flutter use 3-4/hour.  Hemoptysis: 1 episode on 3/25-appears to be mild-none since then.  Stopped aspirin-changed Lovenox back to daily dosing.  Chest  x-ray without any major findings on 3/25. Will obtain a CTA on 12/29/2019 if symptoms persist.  Worsening polycythemia: Concerning that with improving hypoxemia-patient's hemoglobin/hematocrit continues to worsen.  Suspect some of this is from hemoconcentration in the setting of recent Lasix use.  Patient does have a long history of smoking-and only quit approximately 5 months back. Previous MD spoke with Dr. Myna Hidalgo over the phone-suggest that we obtain a erythropoietin level (6.9) - he underwent 250 cc of therapeutic phlebotomy on 12/28/19.  Due to recent hemoptysis-avoiding aspirin-but remains on daily dosing of prophylactic Lovenox. I discussed the case with Dr. Myna Hidalgo on 12/29/2019. For now wait and watch. Note upon admission his hemoglobin was around 14.8.  DM-2 (A1c 8.3 on 12/21/2019) with mild hyperglycemia secondary to steroids: Stable-continue Lantus 14 units, 4 units of NovoLog with meals and SSI.  Continue to follow and optimize.   We will plan on transitioning him back to oral hypoglycemic agents on discharge as he will likely be on steroids for total of 10 days only.  Recent Labs    12/28/19 1656 12/28/19 2138 12/29/19 0736  GLUCAP 137* 138* 151*   HTN: BP controlled-hold amlodipine-as BP stable-in  therapeutic phlebotomy will be performed.  Possible aortic valve density noted on echocardiogram done in the ICU on 12/24/2019. Clinically no signs of bacterial infection or bacteremia, will repeat echocardiogram on 12/29/2019 Limited study to reevaluate. Also check blood cultures and procalcitonin.    Condition - Guarded  Family Communication  :  Spouse Lupita Leash [phone]  updated over the phone 12/29/19  Code Status :  Full Code  Diet :  Diet Order            Diet heart healthy/carb modified Room service appropriate? Yes; Fluid consistency: Thin  Diet effective now               Disposition Plan  : Home with home health services when stable-not keen on SNF-does not have  insurance to go to SNF as well. Still quite hypoxic requiring 4 L of nasal cannula oxygen, having mild ongoing hemoptysis. Repeat echocardiogram limited.  Barriers to discharge: Hypoxia requiring O2 supplementation-see above  Antimicorbials  :    Anti-infectives (From admission, onward)   Start     Dose/Rate Route Frequency Ordered Stop   12/23/19 1000  remdesivir 100 mg in sodium chloride 0.9 % 100 mL IVPB  Status:  Discontinued     100 mg 200 mL/hr over 30 Minutes Intravenous Daily 12/21/19 2231 12/21/19 2246   12/22/19 1000  remdesivir 100 mg in sodium chloride 0.9 % 100 mL IVPB     100 mg 200 mL/hr over 30 Minutes Intravenous Daily 12/21/19 2248 12/25/19 0937   12/22/19 0900  remdesivir 200 mg in sodium chloride 0.9% 250 mL IVPB  Status:  Discontinued     200 mg 580 mL/hr over 30 Minutes Intravenous Once 12/21/19 2231 12/21/19 2246      Inpatient Medications  Scheduled Meds: . dexamethasone (DECADRON) injection  6 mg Intravenous Q24H  . enoxaparin (LOVENOX) injection  40 mg Subcutaneous Q24H  . furosemide  60 mg Intravenous Once  . insulin aspart  0-20 Units Subcutaneous TID WC  . insulin aspart  0-5 Units Subcutaneous QHS  . insulin aspart  4 Units Subcutaneous TID WC  . insulin glargine  14 Units Subcutaneous Daily  . mouth rinse  15 mL Mouth Rinse BID  . sodium chloride  2 spray Each Nare BID   Continuous Infusions:  PRN Meds:.acetaminophen, albuterol, benzonatate, chlorpheniramine-HYDROcodone   Time Spent in minutes  25  See all Orders from today for further details   Susa Raring M.D on 12/29/2019 at 12:01 PM  To page go to [email or web address] - use universal password  [facility] -  Office  [phone]    Objective:   Vitals:   12/29/19 0000 12/29/19 0400 12/29/19 0500 12/29/19 0739  BP: 107/84 102/76  123/88  Pulse: 85 79  90  Resp: (!) 22 20  (!) 31  Temp:  97.9 F (36.6 C)  97.7 F (36.5 C)  TempSrc:  Oral  Oral  SpO2: 99% 100%  93%   Weight:   87.2 kg   Height:        Wt Readings from Last 3 Encounters:  12/29/19 87.2 kg    No intake or output data in the 24 hours ending 12/29/19 1201   Physical Exam  Awake Alert, No new F.N deficits, Normal affect [place].AT,PERRAL Supple Neck,No JVD, No cervical lymphadenopathy appriciated.  Symmetrical Chest wall movement, Good air movement bilaterally, few rales RRR,No Gallops, Rubs or new Murmurs, No Parasternal Heave +ve B.Sounds, Abd Soft, No tenderness, No organomegaly appriciated, No  rebound - guarding or rigidity. No Cyanosis, Clubbing or edema, No new Rash or bruise    Data Review:    CBC Recent Labs  Lab 12/25/19 0209 12/26/19 0314 12/27/19 0404 12/28/19 0514 12/29/19 0307  WBC 17.7* 11.8* 14.0* 14.4* 13.5*  HGB 17.8* 18.0* 18.3* 19.2* 19.1*  HCT 52.9* 54.1* 53.9* 55.3* 54.6*  PLT 875* 676* 742* 741* 501*  MCV 90.7 89.7 89.7 87.6 88.9  MCH 30.5 29.9 30.4 30.4 31.1  MCHC 33.6 33.3 34.0 34.7 35.0  RDW 12.6 12.6 12.7 13.2 13.2    Chemistries  Recent Labs  Lab 12/25/19 0209 12/26/19 0314 12/27/19 0404 12/28/19 0514 12/29/19 0307  NA 136 136 134* 133* 131*  K 4.3 5.2* 5.0 5.1 4.4  CL 101 102 98 97* 99  CO2 17* 18* 21* 21* 18*  GLUCOSE 248* 200* 183* 233* 155*  BUN 38* 40* 45* 47* 38*  CREATININE 1.20 1.20 1.21 1.24 1.12  CALCIUM 9.1 9.0 9.0 9.4 9.0  MG  --   --   --   --  2.2  AST 38 27 27 31  37  ALT 43 38 36 41 43  ALKPHOS 174* 155* 157* 164* 146*  BILITOT 1.5* 1.5* 1.5* 1.6* 2.0*   ------------------------------------------------------------------------------------------------------------------ No results for input(s): CHOL, HDL, LDLCALC, TRIG, CHOLHDL, LDLDIRECT in the last 72 hours.  Lab Results  Component Value Date   HGBA1C 8.3 (H) 12/21/2019   ------------------------------------------------------------------------------------------------------------------ No results for input(s): TSH, T4TOTAL, T3FREE, THYROIDAB in the last 72  hours.  Invalid input(s): FREET3 ------------------------------------------------------------------------------------------------------------------ Recent Labs    12/28/19 0514 12/29/19 0307  FERRITIN 1,178* 1,213*    Coagulation profile No results for input(s): INR, PROTIME in the last 168 hours.  Recent Labs    12/27/19 0404 12/29/19 0307  DDIMER 1.74* 0.89*    Cardiac Enzymes No results for input(s): CKMB, TROPONINI, MYOGLOBIN in the last 168 hours.  Invalid input(s): CK ------------------------------------------------------------------------------------------------------------------    Component Value Date/Time   BNP 175.2 (H) 12/29/2019 0307    Micro Results Recent Results (from the past 240 hour(s))  Culture, blood (routine x 2)     Status: None   Collection Time: 12/21/19 11:18 PM   Specimen: BLOOD RIGHT HAND  Result Value Ref Range Status   Specimen Description BLOOD RIGHT HAND  Final   Special Requests   Final    BOTTLES DRAWN AEROBIC AND ANAEROBIC Blood Culture adequate volume   Culture   Final    NO GROWTH 5 DAYS Performed at [facility], [address] [place], [place]    Report Status 12/27/2019 FINAL  Final  Culture, blood (routine x 2)     Status: None   Collection Time: 12/21/19 11:22 PM   Specimen: BLOOD LEFT HAND  Result Value Ref Range Status   Specimen Description BLOOD LEFT HAND  Final   Special Requests AEROBIC BOTTLE ONLY Blood Culture adequate volume  Final   Culture   Final    NO GROWTH 5 DAYS Performed at [facility], [address] [place], [place]    Report Status 12/27/2019 FINAL  Final  MRSA PCR Screening     Status: None   Collection Time: 12/25/19  4:26 AM   Specimen: Nasal Mucosa; Nasopharyngeal  Result Value Ref Range Status   MRSA by PCR NEGATIVE NEGATIVE Final    Comment:        The GeneXpert MRSA Assay (FDA approved for NASAL specimens only), is one component of  a comprehensive  MRSA colonization surveillance program. It is not intended to diagnose MRSA infection nor to guide or monitor treatment for MRSA infections. Performed at [facility], [address] [place], [place] [address]     Radiology Reports DG Chest [place] View  Result Date: 12/27/2019 CLINICAL DATA:  COVID, cough EXAM: PORTABLE CHEST 1 VIEW COMPARISON:  12/24/2019 FINDINGS: Interstitial prominence again noted throughout the lungs compatible with chronic lung disease/fibrosis. No acute confluent consolidation. No effusions or pneumothorax. Heart is normal size. No acute bony abnormality. IMPRESSION: Stable chronic changes.  No definite acute cardiopulmonary process. Electronically Signed   By: Charlett NoseKevin  Dover M.D.   On: 12/27/2019 19:15   DG CHEST PORT 1 VIEW  Result Date: 12/24/2019 CLINICAL DATA:  Shortness of breath EXAM: PORTABLE CHEST 1 VIEW COMPARISON:  12/23/2019 FINDINGS: Chronic interstitial opacities compatible with chronic lung disease/fibrosis. Pulse heart is normal size. No effusions. No acute bony abnormality. IMPRESSION: Interstitial prominence throughout the lungs, likely chronic/fibrosis. Electronically Signed   By: Charlett NoseKevin  Dover M.D.   On: 12/24/2019 23:37   DG Chest Port 1 View  Result Date: 12/23/2019 CLINICAL DATA:  Respiratory failure.  COVID-19. EXAM: PORTABLE CHEST 1 VIEW COMPARISON:  December 22, 2019 FINDINGS: Bilateral pulmonary infiltrates are stable. The cardiomediastinal silhouette is stable. No pneumothorax. No other acute abnormalities. IMPRESSION: Stable bilateral pulmonary infiltrates consistent with history. Electronically Signed   By: Gerome Samavid  Williams III M.D   On: 12/23/2019 08:06   DG Chest Port 1 View  Result Date: 12/22/2019 CLINICAL DATA:  COVID-19 pneumonia. EXAM: PORTABLE CHEST 1 VIEW COMPARISON:  December 21, 2019. FINDINGS: The heart size and mediastinal contours are within normal limits. No pneumothorax or pleural effusion is noted. Stable right lung opacity  is noted consistent with pneumonia. Mild left basilar atelectasis or infiltrate is noted. The visualized skeletal structures are unremarkable. IMPRESSION: Stable right lung opacity consistent with pneumonia. Mild left basilar atelectasis or infiltrate is noted. Electronically Signed   By: Lupita RaiderJames  Green Jr M.D.   On: 12/22/2019 08:46   ECHOCARDIOGRAM COMPLETE  Result Date: 12/23/2019    ECHOCARDIOGRAM REPORT   Patient Name:   Lattie CornsDWARD Ragsdale Date of Exam: 12/22/2019 Medical Rec #:  [id number]    Height:       70.0 in Accession #:    [id number]   Weight:       212.5 lb Date of Birth:  01-Feb-1956    BSA:          2.142 m Patient Age:    63 years     BP:           131/87 mmHg Patient Gender: M            HR:           83 bpm. Exam Location:  Inpatient Procedure: 2D Echo, Color Doppler, Cardiac Doppler and Intracardiac            Opacification Agent Indications:    R06.02 SOB  History:        Patient has no prior history of Echocardiogram examinations.                 CV19.  Sonographer:    Roosvelt Maserachel Lane RDCS Referring Phys: [id number] [facility]  Sonographer Comments: Technically difficult study due to poor echo windows. IMPRESSIONS  1. Left ventricular ejection fraction, by estimation, is 55 to 60%. The left ventricle has normal function. The left ventricle has no regional wall motion  abnormalities. There is mild left ventricular hypertrophy. Left ventricular diastolic parameters are consistent with Grade I diastolic dysfunction (impaired relaxation).  2. Right ventricular systolic function is normal. The right ventricular size is normal. Tricuspid regurgitation signal is inadequate for assessing PA pressure.  3. The mitral valve is abnormal. No evidence of mitral valve regurgitation. No evidence of mitral stenosis.  4. Due to suboptimal acoustic windows, cannot exclude a small mobile echodensity on the aortic valve with independent motion. Best seen in clip 36. This might represent a calcification with artifact,  cannot exclude mobile calcification or vegetation. The aortic valve is abnormal. Aortic valve regurgitation is trivial. No aortic stenosis is present. FINDINGS  Left Ventricle: Left ventricular ejection fraction, by estimation, is 55 to 60%. The left ventricle has normal function. The left ventricle has no regional wall motion abnormalities. Definity contrast agent was given IV to delineate the left ventricular  endocardial borders. The left ventricular internal cavity size was normal in size. There is mild left ventricular hypertrophy. Left ventricular diastolic parameters are consistent with Grade I diastolic dysfunction (impaired relaxation). Right Ventricle: The right ventricular size is normal. No increase in right ventricular wall thickness. Right ventricular systolic function is normal. Tricuspid regurgitation signal is inadequate for assessing PA pressure. Left Atrium: Left atrial size was normal in size. Right Atrium: Right atrial size was normal in size. Pericardium: There is no evidence of pericardial effusion. Mitral Valve: The mitral valve is abnormal. Normal mobility of the mitral valve leaflets. Mild mitral annular calcification. No evidence of mitral valve regurgitation. No evidence of mitral valve stenosis. Tricuspid Valve: The tricuspid valve is normal in structure. Tricuspid valve regurgitation is trivial. No evidence of tricuspid stenosis. Aortic Valve: Due to suboptimal acoustic windows, cannot exclude a small mobile echodensity on the aortic valve with independent motion. Best seen in clip 36. This might represent a calcification with artifact, cannot exclude mobile calcification or vegetation. The aortic valve is abnormal. Aortic valve regurgitation is trivial. No aortic stenosis is present. There is mild calcification of the aortic valve. Pulmonic Valve: The pulmonic valve was not well visualized. Pulmonic valve regurgitation is trivial. No evidence of pulmonic stenosis. Aorta: The aortic  root is normal in size and structure. Venous: The inferior vena cava was not well visualized. IAS/Shunts: The interatrial septum was not well visualized.  LEFT VENTRICLE PLAX 2D LVIDd:         3.30 cm     Diastology LVIDs:         2.30 cm     LV e' lateral:   12.30 cm/s LV PW:         1.20 cm     LV E/e' lateral: 5.5 LV IVS:        1.20 cm     LV e' medial:    6.42 cm/s                            LV E/e' medial:  10.6  LV Volumes (MOD) LV vol d, MOD A4C: 62.7 ml LV vol s, MOD A4C: 26.9 ml LV SV MOD A4C:     62.7 ml RIGHT VENTRICLE RV Basal diam:  3.50 cm RV S prime:     14.30 cm/s TAPSE (M-mode): 1.9 cm LEFT ATRIUM             Index       RIGHT ATRIUM  Index LA diam:        3.10 cm 1.45 cm/m  RA Area:     18.70 cm LA Vol (A2C):   56.8 ml 26.52 ml/m RA Volume:   46.90 ml  21.90 ml/m LA Vol (A4C):   48.4 ml 22.60 ml/m LA Biplane Vol: 55.3 ml 25.82 ml/m  AORTIC VALVE LVOT Vmax:   77.70 cm/s LVOT Vmean:  49.400 cm/s LVOT VTI:    0.144 m  AORTA Ao Root diam: 2.70 cm MITRAL VALVE MV Area (PHT): 3.08 cm    SHUNTS MV Decel Time: 246 msec    Systemic VTI: 0.14 m MV E velocity: 68.10 cm/s MV A velocity: 79.30 cm/s MV E/A ratio:  0.86 Cherlynn Kaiser MD Electronically signed by Cherlynn Kaiser MD Signature Date/Time: 12/23/2019/10:12:24 AM    Final

## 2019-12-30 ENCOUNTER — Inpatient Hospital Stay (HOSPITAL_COMMUNITY): Payer: HRSA Program

## 2019-12-30 DIAGNOSIS — I5031 Acute diastolic (congestive) heart failure: Secondary | ICD-10-CM

## 2019-12-30 LAB — COMPREHENSIVE METABOLIC PANEL
ALT: 60 U/L — ABNORMAL HIGH (ref 0–44)
AST: 37 U/L (ref 15–41)
Albumin: 3.2 g/dL — ABNORMAL LOW (ref 3.5–5.0)
Alkaline Phosphatase: 163 U/L — ABNORMAL HIGH (ref 38–126)
Anion gap: 15 (ref 5–15)
BUN: 46 mg/dL — ABNORMAL HIGH (ref 8–23)
CO2: 20 mmol/L — ABNORMAL LOW (ref 22–32)
Calcium: 9.2 mg/dL (ref 8.9–10.3)
Chloride: 94 mmol/L — ABNORMAL LOW (ref 98–111)
Creatinine, Ser: 1.43 mg/dL — ABNORMAL HIGH (ref 0.61–1.24)
GFR calc Af Amer: 60 mL/min — ABNORMAL LOW (ref >60)
GFR calc non Af Amer: 52 mL/min — ABNORMAL LOW (ref >60)
Glucose, Bld: 203 mg/dL — ABNORMAL HIGH (ref 70–99)
Potassium: 5.2 mmol/L — ABNORMAL HIGH (ref 3.5–5.1)
Sodium: 129 mmol/L — ABNORMAL LOW (ref 135–145)
Total Bilirubin: 2.5 mg/dL — ABNORMAL HIGH (ref 0.3–1.2)
Total Protein: 7.7 g/dL (ref 6.5–8.1)

## 2019-12-30 LAB — GLUCOSE, CAPILLARY
Glucose-Capillary: 213 mg/dL — ABNORMAL HIGH (ref 70–99)
Glucose-Capillary: 291 mg/dL — ABNORMAL HIGH (ref 70–99)
Glucose-Capillary: 264 mg/dL — ABNORMAL HIGH (ref 70–99)
Glucose-Capillary: 126 mg/dL — ABNORMAL HIGH (ref 70–99)

## 2019-12-30 LAB — CBC WITH DIFFERENTIAL/PLATELET
Abs Immature Granulocytes: 0.15 10*3/uL — ABNORMAL HIGH (ref 0.00–0.07)
Basophils Absolute: 0.1 10*3/uL (ref 0.0–0.1)
Basophils Relative: 0 %
Eosinophils Absolute: 0 10*3/uL (ref 0.0–0.5)
Eosinophils Relative: 0 %
HCT: 57.5 % — ABNORMAL HIGH (ref 39.0–52.0)
Hemoglobin: 20.3 g/dL — ABNORMAL HIGH (ref 13.0–17.0)
Immature Granulocytes: 1 %
Lymphocytes Relative: 8 %
Lymphs Abs: 1.3 10*3/uL (ref 0.7–4.0)
MCH: 30.6 pg (ref 26.0–34.0)
MCHC: 35.3 g/dL (ref 30.0–36.0)
MCV: 86.7 fL (ref 80.0–100.0)
Monocytes Absolute: 0.2 10*3/uL (ref 0.1–1.0)
Monocytes Relative: 1 %
Neutro Abs: 15 10*3/uL — ABNORMAL HIGH (ref 1.7–7.7)
Neutrophils Relative %: 90 %
Platelets: 625 10*3/uL — ABNORMAL HIGH (ref 150–400)
RBC: 6.63 MIL/uL — ABNORMAL HIGH (ref 4.22–5.81)
RDW: 13 % (ref 11.5–15.5)
WBC: 16.6 10*3/uL — ABNORMAL HIGH (ref 4.0–10.5)
nRBC: 0 % (ref 0.0–0.2)

## 2019-12-30 LAB — MAGNESIUM: Magnesium: 2.4 mg/dL (ref 1.7–2.4)

## 2019-12-30 LAB — ECHOCARDIOGRAM LIMITED
Height: 70 in
Weight: 2966.51 oz

## 2019-12-30 LAB — BRAIN NATRIURETIC PEPTIDE: B Natriuretic Peptide: 179.8 pg/mL — ABNORMAL HIGH (ref 0.0–100.0)

## 2019-12-30 LAB — PROCALCITONIN: Procalcitonin: <0.1 ng/mL

## 2019-12-30 LAB — C-REACTIVE PROTEIN: CRP: 0.6 mg/dL (ref <1.0)

## 2019-12-30 LAB — D-DIMER, QUANTITATIVE: D-Dimer, Quant: 1.05 ug/mL-FEU — ABNORMAL HIGH (ref 0.00–0.50)

## 2019-12-30 MED ORDER — SODIUM POLYSTYRENE SULFONATE 15 GM/60ML PO SUSP
30.0000 g | Freq: Once | ORAL | Status: AC
  Administered 2019-12-30: 09:00:00 30 g via ORAL
  Filled 2019-12-30: qty 120

## 2019-12-30 MED ORDER — ALUM & MAG HYDROXIDE-SIMETH 200-200-20 MG/5ML PO SUSP
30.0000 mL | Freq: Three times a day (TID) | ORAL | Status: DC | PRN
  Administered 2019-12-30: 18:00:00 30 mL via ORAL
  Filled 2019-12-30: qty 30

## 2019-12-30 MED ORDER — INSULIN GLARGINE 100 UNIT/ML ~~LOC~~ SOLN
10.0000 [IU] | Freq: Once | SUBCUTANEOUS | Status: AC
  Administered 2019-12-30: 12:00:00 10 [IU] via SUBCUTANEOUS
  Filled 2019-12-30: qty 0.1

## 2019-12-30 MED ORDER — DEXTROSE 5 % IV SOLN: INTRAVENOUS | Status: AC

## 2019-12-30 NOTE — Progress Notes (Signed)
  Echocardiogram 2D Echocardiogram has been performed.  Delcie Roch 12/30/2019, 10:22 AM

## 2019-12-30 NOTE — Progress Notes (Signed)
PROGRESS NOTE                                                                                                                                                                                                             Patient Demographics:    Theodore Kim, is a 65 y.o. male, DOB - 06-04-1956, NWG:[id number]  Outpatient Primary MD for the patient is No primary care provider on file.   Admit date - 12/21/2019   LOS - 9  No chief complaint on file.      Brief Narrative: Patient is a 65 y.o. male with PMHx of DM-2, HTN who was diagnosed with COVID-19 on 3/17-transferred from [facility] to [facility] ICU for worsening hypoxemia.  He was managed in the ICU-and upon stability transfer to the [facility].  See below for further details.  Significant Events: 3/19>> admit to [facility], ICU for worsening hypoxemia-initially requiring 15 L of HFNC 3/20>> TTE EF 55-60% 3/22>> transferred to [facility]  COVID-19 medications: Remdesivir: 3/19>>3/23 Steroids: 3/19>> Actemra: 3/19 x 1  Microbiology data: 3/19: Blood culture: Negative  DVT prophylaxis: Prophylactic Lovenox  Procedures: None  Consults: PCCM   Subjective:   Patient in bed denies any headache no chest or abdominal pain, shortness of breath is improving. Still has a cough, phlegm has mild streaks of blood but no chunks of blood.   Assessment  & Plan :   Acute Hypoxic Resp Failure due to Covid 19 Viral pneumonia: He had severe disease with extreme hypoxia requiring 15 L high flow oxygen, he was treated appropriately with IV Actemra, remdesivir and steroids, oxygen requirements down to 3 to 4 L.  Encouraged to sit up, advance activity and titrate down oxygen.  Also encouraged to use I-S and flutter valve for pulmonary toiletry.  Continue to monitor closely.  Clinically improving.      Recent Labs  Lab 12/26/19 0314 12/27/19 0404 12/28/19 0514  12/29/19 0307 12/30/19 0243  WBC 11.8* 14.0* 14.4* 13.5* 16.6*  HGB 18.0* 18.3* 19.2* 19.1* 20.3*  HCT 54.1* 53.9* 55.3* 54.6* 57.5*  PLT 676* 742* 741* 501* 625*  MCV 89.7 89.7 87.6 88.9 86.7  MCH 29.9 30.4 30.4 31.1 30.6  MCHC 33.3 34.0 34.7 35.0 35.3  RDW 12.6 12.7 13.2 13.2 13.0  LYMPHSABS  --   --   --   --  1.3  MONOABS  --   --   --   --  0.2  EOSABS  --   --   --   --  0.0  BASOSABS  --   --   --   --  0.1    Recent Labs  Lab 12/25/19 0209 12/25/19 0209 12/26/19 0314 12/27/19 0404 12/28/19 0514 12/29/19 0307 12/30/19 0243 12/30/19 0415  NA 136   < > 136 134* 133* 131* 129*  --   K 4.3   < > 5.2* 5.0 5.1 4.4 5.2*  --   CL 101   < > 102 98 97* 99 94*  --   CO2 17*   < > 18* 21* 21* 18* 20*  --   GLUCOSE 248*   < > 200* 183* 233* 155* 203*  --   BUN 38*   < > 40* 45* 47* 38* 46*  --   CREATININE 1.20   < > 1.20 1.21 1.24 1.12 1.43*  --   CALCIUM 9.1   < > 9.0 9.0 9.4 9.0 9.2  --   AST 38   < > 27 27 31  37 37  --   ALT 43   < > 38 36 41 43 60*  --   ALKPHOS 174*   < > 155* 157* 164* 146* 163*  --   BILITOT 1.5*   < > 1.5* 1.5* 1.6* 2.0* 2.5*  --   ALBUMIN 2.8*   < > 2.8* 2.8* 3.0* 3.0* 3.2*  --   MG  --   --   --   --   --  2.2 2.4  --   CRP 2.9*   < > 1.1* 0.7 0.6 0.6 0.6  --   DDIMER 2.81*  --  2.74* 1.74*  --  0.89*  --  1.05*  PROCALCITON  --   --   --   --   --  <0.10 <0.10  --   BNP  --   --  18.2  --   --  175.2* 179.8*  --    < > = values in this interval not displayed.    Recent Labs  Lab 12/25/19 0209 12/25/19 0209 12/26/19 0314 12/27/19 0404 12/28/19 0514 12/29/19 0307 12/30/19 0243 12/30/19 0415  CRP 2.9*   < > 1.1* 0.7 0.6 0.6 0.6  --   DDIMER 2.81*  --  2.74* 1.74*  --  0.89*  --  1.05*  BNP  --   --  18.2  --   --  175.2* 179.8*  --   PROCALCITON  --   --   --   --   --  <0.10 <0.10  --    < > = values in this interval not displayed.        Prone/Incentive Spirometry: encouraged incentive spirometry/flutter use  3-4/hour.  Hemoptysis: 1 episode on 3/25-appears to be mild-none since then.  Stopped aspirin-changed Lovenox back to daily dosing.  Chest x-ray without any major findings on 3/25.  Stable CT angiogram chest..  Worsening polycythemia: Concerning that with improving hypoxemia-patient's hemoglobin/hematocrit continues to worsen.  Suspect some of this is from hemoconcentration in the setting of recent Lasix use.  Patient does have a long history of smoking-and only quit approximately 5 months back. Previous MD spoke with Dr. 4/25 over the phone-suggest that we obtain a erythropoietin level (6.9) - he underwent 250 cc of therapeutic phlebotomy on 12/28/19.  Due to recent hemoptysis-avoiding aspirin-but remains  on daily dosing of prophylactic Lovenox. I discussed the case with Dr. Myna HidalgoEnnever on 12/29/2019. For now wait and watch. Note upon admission his hemoglobin was around 14.8.  HTN: BP controlled-hold amlodipine-as BP stable-in therapeutic phlebotomy will be performed.  Possible aortic valve density noted on echocardiogram done in the ICU on 12/24/2019. Clinically no signs of bacterial infection or bacteremia, will repeat echocardiogram on 12/29/2019 Limited study to reevaluate.  So far stable procalcitonin and blood cultures drawn from 12/29/2019 are negative.  Dehydration with hyponatremia and hyperkalemia.  D5W, avoid nephrotoxins, Kayexalate 1 dose and repeat BMP   DM-2 (A1c 8.3 on 12/21/2019) poor outpatient control due to hyperglycemia  : Lantus and sliding scale.  Monitor and adjust.  CBG (last 3)  Recent Labs    12/29/19 1657 12/29/19 2110 12/30/19 0807  GLUCAP 137* 145* 213*     Condition - Guarded  Family Communication  :  Spouse Lupita Leash- Donna [phone]  updated over the phone 12/29/19, 12/30/19  Code Status :  Full Code  Diet :  Diet Order            Diet heart healthy/carb modified Room service appropriate? Yes; Fluid consistency: Thin  Diet effective now                Disposition Plan  : Home health PT once hypoxia has improved and electrolytes have stabilized, still requiring 4 L nasal cannula oxygen.  Pending repeat limited echocardiogram.  Barriers to discharge: Hypoxia requiring O2 supplementation-see above  Antimicorbials  :    Anti-infectives (From admission, onward)   Start     Dose/Rate Route Frequency Ordered Stop   12/23/19 1000  remdesivir 100 mg in sodium chloride 0.9 % 100 mL IVPB  Status:  Discontinued     100 mg 200 mL/hr over 30 Minutes Intravenous Daily 12/21/19 2231 12/21/19 2246   12/22/19 1000  remdesivir 100 mg in sodium chloride 0.9 % 100 mL IVPB     100 mg 200 mL/hr over 30 Minutes Intravenous Daily 12/21/19 2248 12/25/19 0937   12/22/19 0900  remdesivir 200 mg in sodium chloride 0.9% 250 mL IVPB  Status:  Discontinued     200 mg 580 mL/hr over 30 Minutes Intravenous Once 12/21/19 2231 12/21/19 2246      Inpatient Medications  Scheduled Meds:  enoxaparin (LOVENOX) injection  40 mg Subcutaneous Q24H   insulin aspart  0-20 Units Subcutaneous TID WC   insulin aspart  0-5 Units Subcutaneous QHS   insulin glargine  14 Units Subcutaneous Daily   mouth rinse  15 mL Mouth Rinse BID   sodium chloride  2 spray Each Nare BID   Continuous Infusions:  dextrose 100 mL/hr at 12/30/19 0842   PRN Meds:.acetaminophen, albuterol, benzonatate, chlorpheniramine-HYDROcodone   Time Spent in minutes  25  See all Orders from today for further details   Susa RaringPrashant Kiya Eno M.D on 12/30/2019 at 10:06 AM  To page go to [email or web address] - use universal password  [facility] -  Office  [phone]    Objective:   Vitals:   12/29/19 1640 12/29/19 2032 12/30/19 0002 12/30/19 0400  BP: 90/68 101/80 108/73 99/86  Pulse: 99 (!) 102 93 96  Resp: 16 18 20  (!) 23  Temp: 97.8 F (36.6 C) 97.8 F (36.6 C) 97.7 F (36.5 C) 97.7 F (36.5 C)  TempSrc: Oral Oral  Oral  SpO2: 92% 93% 92% 93%  Weight:    84.1 kg  Height:  Wt Readings from Last 3 Encounters:  12/30/19 84.1 kg     Intake/Output Summary (Last 24 hours) at 12/30/2019 1006 Last data filed at 12/30/2019 0900 Gross per 24 hour  Intake 120 ml  Output 2050 ml  Net -1930 ml     Physical Exam  Awake Alert, No new F.N deficits, Normal affect [place].AT,PERRAL Supple Neck,No JVD, No cervical lymphadenopathy appriciated.  Symmetrical Chest wall movement, Good air movement bilaterally, CTAB RRR,No Gallops, Rubs or new Murmurs, No Parasternal Heave +ve B.Sounds, Abd Soft, No tenderness, No organomegaly appriciated, No rebound - guarding or rigidity. No Cyanosis, Clubbing or edema, No new Rash or bruise   Data Review:    CBC Recent Labs  Lab 12/26/19 0314 12/27/19 0404 12/28/19 0514 12/29/19 0307 12/30/19 0243  WBC 11.8* 14.0* 14.4* 13.5* 16.6*  HGB 18.0* 18.3* 19.2* 19.1* 20.3*  HCT 54.1* 53.9* 55.3* 54.6* 57.5*  PLT 676* 742* 741* 501* 625*  MCV 89.7 89.7 87.6 88.9 86.7  MCH 29.9 30.4 30.4 31.1 30.6  MCHC 33.3 34.0 34.7 35.0 35.3  RDW 12.6 12.7 13.2 13.2 13.0  LYMPHSABS  --   --   --   --  1.3  MONOABS  --   --   --   --  0.2  EOSABS  --   --   --   --  0.0  BASOSABS  --   --   --   --  0.1    Chemistries  Recent Labs  Lab 12/26/19 0314 12/27/19 0404 12/28/19 0514 12/29/19 0307 12/30/19 0243  NA 136 134* 133* 131* 129*  K 5.2* 5.0 5.1 4.4 5.2*  CL 102 98 97* 99 94*  CO2 18* 21* 21* 18* 20*  GLUCOSE 200* 183* 233* 155* 203*  BUN 40* 45* 47* 38* 46*  CREATININE 1.20 1.21 1.24 1.12 1.43*  CALCIUM 9.0 9.0 9.4 9.0 9.2  MG  --   --   --  2.2 2.4  AST 27 27 31  37 37  ALT 38 36 41 43 60*  ALKPHOS 155* 157* 164* 146* 163*  BILITOT 1.5* 1.5* 1.6* 2.0* 2.5*   ------------------------------------------------------------------------------------------------------------------ No results for input(s): CHOL, HDL, LDLCALC, TRIG, CHOLHDL, LDLDIRECT in the last 72 hours.  Lab Results  Component Value Date   HGBA1C 8.3 (H)  12/21/2019   ------------------------------------------------------------------------------------------------------------------ No results for input(s): TSH, T4TOTAL, T3FREE, THYROIDAB in the last 72 hours.  Invalid input(s): FREET3 ------------------------------------------------------------------------------------------------------------------ Recent Labs    12/28/19 0514 12/29/19 0307  FERRITIN 1,178* 1,213*    Coagulation profile No results for input(s): INR, PROTIME in the last 168 hours.  Recent Labs    12/29/19 0307 12/30/19 0415  DDIMER 0.89* 1.05*    Cardiac Enzymes No results for input(s): CKMB, TROPONINI, MYOGLOBIN in the last 168 hours.  Invalid input(s): CK ------------------------------------------------------------------------------------------------------------------    Component Value Date/Time   BNP 179.8 (H) 12/30/2019 0243    Micro Results Recent Results (from the past 240 hour(s))  Culture, blood (routine x 2)     Status: None   Collection Time: 12/21/19 11:18 PM   Specimen: BLOOD RIGHT HAND  Result Value Ref Range Status   Specimen Description BLOOD RIGHT HAND  Final   Special Requests   Final    BOTTLES DRAWN AEROBIC AND ANAEROBIC Blood Culture adequate volume   Culture   Final    NO GROWTH 5 DAYS Performed at [facility], [address], [place], [place]    Report Status 12/27/2019 FINAL  Final  Culture, blood (routine x 2)     Status: None   Collection Time: 12/21/19 11:22 PM   Specimen: BLOOD LEFT HAND  Result Value Ref Range Status   Specimen Description BLOOD LEFT HAND  Final   Special Requests AEROBIC BOTTLE ONLY Blood Culture adequate volume  Final   Culture   Final    NO GROWTH 5 DAYS Performed at [facility], [address] [place], [place] [address]    Report Status 12/27/2019 FINAL  Final  MRSA PCR Screening     Status: None   Collection Time: 12/25/19  4:26 AM   Specimen: Nasal Mucosa;  Nasopharyngeal  Result Value Ref Range Status   MRSA by PCR NEGATIVE NEGATIVE Final    Comment:        The GeneXpert MRSA Assay (FDA approved for NASAL specimens only), is one component of a comprehensive MRSA colonization surveillance program. It is not intended to diagnose MRSA infection nor to guide or monitor treatment for MRSA infections. Performed at [facility], [address], [place], [place] [address]   Culture, blood (routine x 2)     Status: None (Preliminary result)   Collection Time: 12/29/19 12:52 PM   Specimen: BLOOD  Result Value Ref Range Status   Specimen Description BLOOD RIGHT ANTECUBITAL  Final   Special Requests   Final    AEROBIC BOTTLE ONLY Blood Culture results may not be optimal due to an inadequate volume of blood received in culture bottles   Culture   Final    NO GROWTH < 24 HOURS Performed at [facility], [address] [place], [place] [address]    Report Status PENDING  Incomplete  Culture, blood (routine x 2)     Status: None (Preliminary result)   Collection Time: 12/29/19  1:00 PM   Specimen: BLOOD RIGHT HAND  Result Value Ref Range Status   Specimen Description BLOOD RIGHT HAND  Final   Special Requests   Final    BOTTLES DRAWN AEROBIC AND ANAEROBIC Blood Culture adequate volume   Culture   Final    NO GROWTH < 24 HOURS Performed at [facility], [address], [place], [place] [address]    Report Status PENDING  Incomplete    Radiology Reports CT CHEST W CONTRAST  Result Date: 12/29/2019 CLINICAL DATA:  Hemoptysis. COVID EXAM: CT CHEST WITH CONTRAST TECHNIQUE: Multidetector CT imaging of the chest was performed during intravenous contrast administration. CONTRAST:  75mL OMNIPAQUE IOHEXOL 300 MG/ML  SOLN COMPARISON:  None. FINDINGS: Cardiovascular: Heart size normal. No pericardial effusion. Coronary calcifications. Aortic Atherosclerosis (ICD10-170.0). Mediastinum/Nodes: Enlarged AP window and right paratracheal  nodes up to 1.5 cm short axis diameter. No definite hilar adenopathy. Lungs/Pleura: No pleural effusion. No pneumothorax. Bronchiectasis posteriorly in both lower lobes. Coarse subpleural interstitial thickening in both lungs. Patchy ground-glass opacities scattered throughout most lungs predominantly in the periphery. Upper Abdomen: No acute findings Musculoskeletal: Lower cervical spondylitic changes. No fracture or worrisome bone lesion. IMPRESSION: 1. Patchy ground-glass opacities in the periphery of both lungs, nonspecific. Differential diagnosis includes atypical pneumonia, hypersensitivity pneumonitis, and alveolar hemorrhage. 2. Mediastinal adenopathy, possibly reactive but nonspecific. 3. Coronary and Aortic Atherosclerosis (ICD10-170.0). 4. Coarse subpleural interstitial thickening in both lungs suggesting interstitial lung disease, with bibasilar bronchiectasis. Electronically Signed   By: Corlis Leak M.D.   On: 12/29/2019 14:48   DG Chest Port 1 View  Result Date: 12/30/2019 CLINICAL DATA:  Shortness of  breath, COVID EXAM: PORTABLE CHEST 1 VIEW COMPARISON:  December 29, 2019 FINDINGS: The mediastinal contour and cardiac silhouette are normal. Increased pulmonary interstitium and hazy ground-glass opacities are identified throughout both lungs unchanged. There is no pleural effusion. No acute osseous abnormality is identified. IMPRESSION: Increased pulmonary interstitium and hazy ground-glass opacities throughout both lungs unchanged compared to prior exam. Electronically Signed   By: Sherian Rein M.D.   On: 12/30/2019 08:30   DG Chest Port 1 View  Result Date: 12/29/2019 CLINICAL DATA:  SOB, center chest tightness.  Covid-19 positive. EXAM: PORTABLE CHEST - 1 VIEW COMPARISON:  12/27/2019 FINDINGS: Low volumes. Slight worsening of coarse peripheral interstitial and airspace opacities, involving bases more than apices, right greater than left. Heart size and mediastinal contours are within normal  limits. No effusion. No pneumothorax. Visualized bones unremarkable. IMPRESSION: Worsening asymmetric infiltrates. Electronically Signed   By: Corlis Leak M.D.   On: 12/29/2019 12:57   DG Chest Port 1 View  Result Date: 12/27/2019 CLINICAL DATA:  COVID, cough EXAM: PORTABLE CHEST 1 VIEW COMPARISON:  12/24/2019 FINDINGS: Interstitial prominence again noted throughout the lungs compatible with chronic lung disease/fibrosis. No acute confluent consolidation. No effusions or pneumothorax. Heart is normal size. No acute bony abnormality. IMPRESSION: Stable chronic changes.  No definite acute cardiopulmonary process. Electronically Signed   By: Charlett Nose M.D.   On: 12/27/2019 19:15   DG CHEST PORT 1 VIEW  Result Date: 12/24/2019 CLINICAL DATA:  Shortness of breath EXAM: PORTABLE CHEST 1 VIEW COMPARISON:  12/23/2019 FINDINGS: Chronic interstitial opacities compatible with chronic lung disease/fibrosis. Pulse heart is normal size. No effusions. No acute bony abnormality. IMPRESSION: Interstitial prominence throughout the lungs, likely chronic/fibrosis. Electronically Signed   By: Charlett Nose M.D.   On: 12/24/2019 23:37   DG Chest Port 1 View  Result Date: 12/23/2019 CLINICAL DATA:  Respiratory failure.  COVID-19. EXAM: PORTABLE CHEST 1 VIEW COMPARISON:  December 22, 2019 FINDINGS: Bilateral pulmonary infiltrates are stable. The cardiomediastinal silhouette is stable. No pneumothorax. No other acute abnormalities. IMPRESSION: Stable bilateral pulmonary infiltrates consistent with history. Electronically Signed   By: Gerome Sam III M.D   On: 12/23/2019 08:06   DG Chest Port 1 View  Result Date: 12/22/2019 CLINICAL DATA:  COVID-19 pneumonia. EXAM: PORTABLE CHEST 1 VIEW COMPARISON:  December 21, 2019. FINDINGS: The heart size and mediastinal contours are within normal limits. No pneumothorax or pleural effusion is noted. Stable right lung opacity is noted consistent with pneumonia. Mild left basilar  atelectasis or infiltrate is noted. The visualized skeletal structures are unremarkable. IMPRESSION: Stable right lung opacity consistent with pneumonia. Mild left basilar atelectasis or infiltrate is noted. Electronically Signed   By: Lupita Raider M.D.   On: 12/22/2019 08:46   ECHOCARDIOGRAM COMPLETE  Result Date: 12/23/2019    ECHOCARDIOGRAM REPORT   Patient Name:   JB DULWORTH Date of Exam: 12/22/2019 Medical Rec #:  [id number]    Height:       70.0 in Accession #:    [id number]   Weight:       212.5 lb Date of Birth:  1956/09/18    BSA:          2.142 m Patient Age:    63 years     BP:           131/87 mmHg Patient Gender: M            HR:  83 bpm. Exam Location:  Inpatient Procedure: 2D Echo, Color Doppler, Cardiac Doppler and Intracardiac            Opacification Agent Indications:    R06.02 SOB  History:        Patient has no prior history of Echocardiogram examinations.                 CV19.  Sonographer:    Merrie Roof RDCS Referring Phys: [id number] [facility]  Sonographer Comments: Technically difficult study due to poor echo windows. IMPRESSIONS  1. Left ventricular ejection fraction, by estimation, is 55 to 60%. The left ventricle has normal function. The left ventricle has no regional wall motion abnormalities. There is mild left ventricular hypertrophy. Left ventricular diastolic parameters are consistent with Grade I diastolic dysfunction (impaired relaxation).  2. Right ventricular systolic function is normal. The right ventricular size is normal. Tricuspid regurgitation signal is inadequate for assessing PA pressure.  3. The mitral valve is abnormal. No evidence of mitral valve regurgitation. No evidence of mitral stenosis.  4. Due to suboptimal acoustic windows, cannot exclude a small mobile echodensity on the aortic valve with independent motion. Best seen in clip 36. This might represent a calcification with artifact, cannot exclude mobile calcification or vegetation. The  aortic valve is abnormal. Aortic valve regurgitation is trivial. No aortic stenosis is present. FINDINGS  Left Ventricle: Left ventricular ejection fraction, by estimation, is 55 to 60%. The left ventricle has normal function. The left ventricle has no regional wall motion abnormalities. Definity contrast agent was given IV to delineate the left ventricular  endocardial borders. The left ventricular internal cavity size was normal in size. There is mild left ventricular hypertrophy. Left ventricular diastolic parameters are consistent with Grade I diastolic dysfunction (impaired relaxation). Right Ventricle: The right ventricular size is normal. No increase in right ventricular wall thickness. Right ventricular systolic function is normal. Tricuspid regurgitation signal is inadequate for assessing PA pressure. Left Atrium: Left atrial size was normal in size. Right Atrium: Right atrial size was normal in size. Pericardium: There is no evidence of pericardial effusion. Mitral Valve: The mitral valve is abnormal. Normal mobility of the mitral valve leaflets. Mild mitral annular calcification. No evidence of mitral valve regurgitation. No evidence of mitral valve stenosis. Tricuspid Valve: The tricuspid valve is normal in structure. Tricuspid valve regurgitation is trivial. No evidence of tricuspid stenosis. Aortic Valve: Due to suboptimal acoustic windows, cannot exclude a small mobile echodensity on the aortic valve with independent motion. Best seen in clip 36. This might represent a calcification with artifact, cannot exclude mobile calcification or vegetation. The aortic valve is abnormal. Aortic valve regurgitation is trivial. No aortic stenosis is present. There is mild calcification of the aortic valve. Pulmonic Valve: The pulmonic valve was not well visualized. Pulmonic valve regurgitation is trivial. No evidence of pulmonic stenosis. Aorta: The aortic root is normal in size and structure. Venous: The inferior  vena cava was not well visualized. IAS/Shunts: The interatrial septum was not well visualized.  LEFT VENTRICLE PLAX 2D LVIDd:         3.30 cm     Diastology LVIDs:         2.30 cm     LV e' lateral:   12.30 cm/s LV PW:         1.20 cm     LV E/e' lateral: 5.5 LV IVS:        1.20 cm     LV e'  medial:    6.42 cm/s                            LV E/e' medial:  10.6  LV Volumes (MOD) LV vol d, MOD A4C: 62.7 ml LV vol s, MOD A4C: 26.9 ml LV SV MOD A4C:     62.7 ml RIGHT VENTRICLE RV Basal diam:  3.50 cm RV S prime:     14.30 cm/s TAPSE (M-mode): 1.9 cm LEFT ATRIUM             Index       RIGHT ATRIUM           Index LA diam:        3.10 cm 1.45 cm/m  RA Area:     18.70 cm LA Vol (A2C):   56.8 ml 26.52 ml/m RA Volume:   46.90 ml  21.90 ml/m LA Vol (A4C):   48.4 ml 22.60 ml/m LA Biplane Vol: 55.3 ml 25.82 ml/m  AORTIC VALVE LVOT Vmax:   77.70 cm/s LVOT Vmean:  49.400 cm/s LVOT VTI:    0.144 m  AORTA Ao Root diam: 2.70 cm MITRAL VALVE MV Area (PHT): 3.08 cm    SHUNTS MV Decel Time: 246 msec    Systemic VTI: 0.14 m MV E velocity: 68.10 cm/s MV A velocity: 79.30 cm/s MV E/A ratio:  0.86 Weston Brass MD Electronically signed by Weston Brass MD Signature Date/Time: 12/23/2019/10:12:24 AM    Final

## 2019-12-31 LAB — CBC WITH DIFFERENTIAL/PLATELET
Abs Immature Granulocytes: 0.09 10*3/uL — ABNORMAL HIGH (ref 0.00–0.07)
Basophils Absolute: 0.1 10*3/uL (ref 0.0–0.1)
Basophils Relative: 0 %
Eosinophils Absolute: 0.1 10*3/uL (ref 0.0–0.5)
Eosinophils Relative: 1 %
HCT: 54.4 % — ABNORMAL HIGH (ref 39.0–52.0)
Hemoglobin: 19.2 g/dL — ABNORMAL HIGH (ref 13.0–17.0)
Immature Granulocytes: 1 %
Lymphocytes Relative: 14 %
Lymphs Abs: 2.2 10*3/uL (ref 0.7–4.0)
MCH: 30.5 pg (ref 26.0–34.0)
MCHC: 35.3 g/dL (ref 30.0–36.0)
MCV: 86.5 fL (ref 80.0–100.0)
Monocytes Absolute: 1.1 10*3/uL — ABNORMAL HIGH (ref 0.1–1.0)
Monocytes Relative: 7 %
Neutro Abs: 12.4 10*3/uL — ABNORMAL HIGH (ref 1.7–7.7)
Neutrophils Relative %: 77 %
Platelets: 482 10*3/uL — ABNORMAL HIGH (ref 150–400)
RBC: 6.29 MIL/uL — ABNORMAL HIGH (ref 4.22–5.81)
RDW: 12.9 % (ref 11.5–15.5)
WBC: 15.9 10*3/uL — ABNORMAL HIGH (ref 4.0–10.5)
nRBC: 0 % (ref 0.0–0.2)

## 2019-12-31 LAB — COMPREHENSIVE METABOLIC PANEL
ALT: 60 U/L — ABNORMAL HIGH (ref 0–44)
AST: 43 U/L — ABNORMAL HIGH (ref 15–41)
Albumin: 2.9 g/dL — ABNORMAL LOW (ref 3.5–5.0)
Alkaline Phosphatase: 143 U/L — ABNORMAL HIGH (ref 38–126)
Anion gap: 16 — ABNORMAL HIGH (ref 5–15)
BUN: 40 mg/dL — ABNORMAL HIGH (ref 8–23)
CO2: 20 mmol/L — ABNORMAL LOW (ref 22–32)
Calcium: 8.6 mg/dL — ABNORMAL LOW (ref 8.9–10.3)
Chloride: 93 mmol/L — ABNORMAL LOW (ref 98–111)
Creatinine, Ser: 1.15 mg/dL (ref 0.61–1.24)
GFR calc Af Amer: >60 mL/min (ref >60)
GFR calc non Af Amer: >60 mL/min (ref >60)
Glucose, Bld: 112 mg/dL — ABNORMAL HIGH (ref 70–99)
Potassium: 3.2 mmol/L — ABNORMAL LOW (ref 3.5–5.1)
Sodium: 129 mmol/L — ABNORMAL LOW (ref 135–145)
Total Bilirubin: 2.6 mg/dL — ABNORMAL HIGH (ref 0.3–1.2)
Total Protein: 6.8 g/dL (ref 6.5–8.1)

## 2019-12-31 LAB — BRAIN NATRIURETIC PEPTIDE: B Natriuretic Peptide: 33.5 pg/mL (ref 0.0–100.0)

## 2019-12-31 LAB — GLUCOSE, CAPILLARY
Glucose-Capillary: 177 mg/dL — ABNORMAL HIGH (ref 70–99)
Glucose-Capillary: 172 mg/dL — ABNORMAL HIGH (ref 70–99)
Glucose-Capillary: 252 mg/dL — ABNORMAL HIGH (ref 70–99)
Glucose-Capillary: 91 mg/dL (ref 70–99)

## 2019-12-31 LAB — C-REACTIVE PROTEIN: CRP: <0.5 mg/dL (ref <1.0)

## 2019-12-31 LAB — PROCALCITONIN: Procalcitonin: 0.21 ng/mL

## 2019-12-31 LAB — D-DIMER, QUANTITATIVE: D-Dimer, Quant: 0.65 ug/mL-FEU — ABNORMAL HIGH (ref 0.00–0.50)

## 2019-12-31 LAB — MAGNESIUM: Magnesium: 2.3 mg/dL (ref 1.7–2.4)

## 2019-12-31 MED ORDER — POTASSIUM CHLORIDE CRYS ER 20 MEQ PO TBCR
40.0000 meq | EXTENDED_RELEASE_TABLET | Freq: Once | ORAL | Status: AC
  Administered 2019-12-31: 09:00:00 40 meq via ORAL
  Filled 2019-12-31: qty 2

## 2019-12-31 MED ORDER — LACTATED RINGERS IV SOLN: INTRAVENOUS | Status: AC

## 2019-12-31 NOTE — Progress Notes (Addendum)
Physical Therapy Treatment Note  Patient with improved activity tolerance and on less suppl oxygen compared to Friday's session, although he does still present below his baseline level of mobility. Patient is supervision for ambulation in room from chair<>door with 4WW/rollator with seated rest break halfway through. Oxygen saturation down to 81% on 2L for first ambulation trial and 3L for 2nd ambulation trial although patient does recover with seated rest to 95%. PT managed oxygen tank and tubing this session. Patient on 1L at rest.  If patient's mobility and activity tolerance continue to improve in this direction anticipate he can discharge home with his wife with home PT and supervision/assist from his wife, especially for oxygen tube management. Patient reports his wife has already recovered from COVID.  Patient does not yet demonstrate the activity tolerance needed to negotiate the three steps to enter his home. Patient reports he has guys to help him into the home but education provided that patient is still COVID positive and under isolation. At this time, would recommend w/c van or ambulance home but will continue to assess this. Recommend 4WW/rollator and BSC.    12/31/19 1446  PT Visit Information  Last PT Received On 12/31/19  Assistance Needed +1  History of Present Illness 64 year old male admitted 12/21/19, transferred from [facility], to ICU for worsening hypoxemia. He was treated with IV Actemra, Remdesivir, and steroirds. Noted patient with worsening polycythemia. Patient was diagnosed with COVID on 12/19/19. PMH: DM2  Subjective Data  Subjective Patient reports feeling better today.  Precautions  Precautions Fall;Other (comment)  Precaution Comments monitor oxygen saturation  Restrictions  Weight Bearing Restrictions No  Pain Assessment  Pain Assessment No/denies pain (no complaints of pain, no signs/symptoms)  Cognition  Arousal/Alertness Awake/alert  Behavior During  Therapy WFL for tasks assessed/performed  Overall Cognitive Status Within Functional Limits for tasks assessed  Bed Mobility  General bed mobility comments Patient just finished OT session. He is sitting in recliner chair.  Transfers  Overall transfer level Needs assistance  Equipment used 4-wheeled walker  Transfers Sit to/from Stand  Sit to Stand Supervision  General transfer comment sit<>stand from chair with [phone] and cues for brake mgmt prior to sitting. Sit<>stand from rollator seat with cues for safety with oxygen tubing.   Ambulation/Gait  Ambulation/Gait assistance Supervision  Gait Distance (Feet) 22 Feet (x 2)  Assistive device 4-wheeled walker  Gait Pattern/deviations Trunk flexed  General Gait Details Patient on 2L to ambulate to door in room. Patient on 3L to ambulate from door back to chair in room.  Gait velocity decreased  Stairs  (didn't attempt due to patient fatigue and desats w/ walking)  Balance  Overall balance assessment Needs assistance  Standing balance support Bilateral upper extremity supported;Single extremity supported  Standing balance-Leahy Scale Fair  General Comments  General comments (skin integrity, edema, etc.) Patient on 1L HFNC at rest, 2L with first gait trial, 3L on 2nd gait trial, DOE noted but improved from Friday's session. Patient better able to perform PLB. He desats to 81% but reovers with a few minutes seated rest. Oxygen saturation at rest at end of session 95% on 1L.  PT - End of Session  Equipment Utilized During Treatment Oxygen  Activity Tolerance Patient limited by fatigue  Patient left in chair;with call bell/phone within reach  Nurse Communication Mobility status;Other (comment) (oxygen saturation response)   PT - Assessment/Plan  PT Plan Discharge plan needs to be updated  PT Visit Diagnosis Unsteadiness on feet (  R26.81);Other abnormalities of gait and mobility (R26.89);Muscle weakness (generalized) (M62.81)  PT Frequency  (ACUTE ONLY) Min 3X/week  Follow Up Recommendations Home health PT;Supervision/Assistance - 24 hour  PT equipment 3in1 (PT);Other (comment) (4WW/rollator)  AM-PAC PT "6 Clicks" Mobility Outcome Measure (Version 2)  Help needed turning from your back to your side while in a flat bed without using bedrails? 4  Help needed moving from lying on your back to sitting on the side of a flat bed without using bedrails? 4  Help needed moving to and from a bed to a chair (including a wheelchair)? 3  Help needed standing up from a chair using your arms (e.g., wheelchair or bedside chair)? 3  Help needed to walk in hospital room? 3  Help needed climbing 3-5 steps with a railing?  2  6 Click Score 19  Consider Recommendation of Discharge To: Home with HH  PT Goal Progression  Progress towards PT goals Progressing toward goals  PT Time Calculation  PT Start Time (ACUTE ONLY) 1446  PT Stop Time (ACUTE ONLY) 1523  PT Time Calculation (min) (ACUTE ONLY) 37 min  PT General Charges  $$ ACUTE PT VISIT 1 Visit  PT Treatments  $Gait Training 23-37 mins   Birdie Hopes, PT, DPT Acute Rehab [phone] office

## 2019-12-31 NOTE — Progress Notes (Signed)
PROGRESS NOTE                                                                                                                                                                                                             Patient Demographics:    Theodore Kim, is a 64 y.o. male, DOB - 03-19-1956, ZOX:[id number]  Outpatient Primary MD for the patient is No primary care provider on file.   Admit date - 12/21/2019   LOS - 10  No chief complaint on file.      Brief Narrative: Patient is a 64 y.o. male with PMHx of DM-2, HTN who was diagnosed with COVID-19 on 3/17-transferred from [facility] to [facility] ICU for worsening hypoxemia.  He was managed in the ICU-and upon stability transfer to the [facility].  See below for further details.  Significant Events: 3/19>> admit to [facility], ICU for worsening hypoxemia-initially requiring 15 L of HFNC 3/20>> TTE EF 55-60% 3/22>> transferred to [facility]  COVID-19 medications: Remdesivir: 3/19>>3/23 Steroids: 3/19>> Actemra: 3/19 x 1  Microbiology data: 3/19: Blood culture: Negative  DVT prophylaxis: Prophylactic Lovenox  Procedures: None  Consults: PCCM   Subjective:   Patient in bed, appears comfortable, denies any headache, no fever, no chest pain or pressure, no shortness of breath , no abdominal pain. No focal weakness.   Assessment  & Plan :   Acute Hypoxic Resp Failure due to Covid 19 Viral pneumonia: He had severe disease with extreme hypoxia requiring 15 L high flow oxygen, he was treated appropriately with IV Actemra, remdesivir and steroids, oxygen requirements down to 3 to 4 L.  Encouraged to sit up, advance activity and titrate down oxygen.  Also encouraged to use I-S and flutter valve for pulmonary toiletry.  Continue to monitor closely.  Clinically improving.      Recent Labs  Lab 12/27/19 0404 12/28/19 0514 12/29/19 0307 12/30/19 0243  12/31/19 0502  WBC 14.0* 14.4* 13.5* 16.6* 15.9*  HGB 18.3* 19.2* 19.1* 20.3* 19.2*  HCT 53.9* 55.3* 54.6* 57.5* 54.4*  PLT 742* 741* 501* 625* 482*  MCV 89.7 87.6 88.9 86.7 86.5  MCH 30.4 30.4 31.1 30.6 30.5  MCHC 34.0 34.7 35.0 35.3 35.3  RDW 12.7 13.2 13.2 13.0 12.9  LYMPHSABS  --   --   --  1.3 2.2  MONOABS  --   --   --  0.2 1.1*  EOSABS  --   --   --  0.0 0.1  BASOSABS  --   --   --  0.1 0.1    Recent Labs  Lab 12/26/19 0314 12/26/19 0314 12/27/19 0404 12/28/19 0514 12/29/19 0307 12/30/19 0243 12/30/19 0415 12/31/19 0502  NA 136   < > 134* 133* 131* 129*  --  129*  K 5.2*   < > 5.0 5.1 4.4 5.2*  --  3.2*  CL 102   < > 98 97* 99 94*  --  93*  CO2 18*   < > 21* 21* 18* 20*  --  20*  GLUCOSE 200*   < > 183* 233* 155* 203*  --  112*  BUN 40*   < > 45* 47* 38* 46*  --  40*  CREATININE 1.20   < > 1.21 1.24 1.12 1.43*  --  1.15  CALCIUM 9.0   < > 9.0 9.4 9.0 9.2  --  8.6*  AST 27   < > 27 31 37 37  --  43*  ALT 38   < > 36 41 43 60*  --  60*  ALKPHOS 155*   < > 157* 164* 146* 163*  --  143*  BILITOT 1.5*   < > 1.5* 1.6* 2.0* 2.5*  --  2.6*  ALBUMIN 2.8*   < > 2.8* 3.0* 3.0* 3.2*  --  2.9*  MG  --   --   --   --  2.2 2.4  --  2.3  CRP 1.1*   < > 0.7 0.6 0.6 0.6  --  <0.5  DDIMER 2.74*  --  1.74*  --  0.89*  --  1.05* 0.65*  PROCALCITON  --   --   --   --  <0.10 <0.10  --  0.21  BNP 18.2  --   --   --  175.2* 179.8*  --  33.5   < > = values in this interval not displayed.    Recent Labs  Lab 12/26/19 0314 12/26/19 0314 12/27/19 0404 12/28/19 0514 12/29/19 0307 12/30/19 0243 12/30/19 0415 12/31/19 0502  CRP 1.1*   < > 0.7 0.6 0.6 0.6  --  <0.5  DDIMER 2.74*  --  1.74*  --  0.89*  --  1.05* 0.65*  BNP 18.2  --   --   --  175.2* 179.8*  --  33.5  PROCALCITON  --   --   --   --  <0.10 <0.10  --  0.21   < > = values in this interval not displayed.        Prone/Incentive Spirometry: encouraged incentive spirometry/flutter use 3-4/hour.  Hemoptysis: 1 episode  on 3/25-appears to be mild-none since then.  Stopped aspirin-changed Lovenox back to daily dosing.  Chest x-ray without any major findings on 3/25.  Stable CT angiogram chest..  Worsening polycythemia: Concerning that with improving hypoxemia-patient's hemoglobin/hematocrit continues to worsen.  Suspect some of this is from hemoconcentration in the setting of recent Lasix use.  Patient does have a long history of smoking-and only quit approximately 5 months back. Previous MD spoke with Dr. Myna Hidalgo over the phone-suggest that we obtain a erythropoietin level (6.9) - he underwent 250 cc of therapeutic phlebotomy on 12/28/19.  Due to recent hemoptysis-avoiding aspirin-but remains on daily dosing of prophylactic Lovenox. I discussed the case with Dr. Myna Hidalgo on 12/29/2019. For now wait and watch. Note upon admission his hemoglobin was around 14.8.  HTN: BP controlled-hold amlodipine-as BP stable-in therapeutic phlebotomy will be performed.  Possible aortic valve density noted on echocardiogram done in the ICU on 12/24/2019. Clinically no signs of bacterial infection or bacteremia, repeat echocardiogram limited done on 12/30/2019 shows normal aortic valve.  Negative blood cultures and stable procalcitonin.  Dehydration with hyponatremia and hypokalemia.  Replace potassium, IV LR on 12/31/2019.  DM-2 (A1c 8.3 on 12/21/2019) poor outpatient control due to hyperglycemia  : Lantus and sliding scale.  Monitor and adjust.  CBG (last 3)  Recent Labs    12/30/19 1703 12/30/19 2115 12/31/19 0803  GLUCAP 264* 126* 177*     Condition - Guarded  Family Communication  :  Spouse Butch Penny [phone]  updated over the phone 12/29/19, 12/30/19, 12/31/19  Code Status :  Full Code  Diet :  Diet Order            Diet heart healthy/carb modified Room service appropriate? Yes; Fluid consistency: Thin  Diet effective now               Disposition Plan  : Home health PT once hypoxia has improved and electrolytes  have stabilized, severely dehydrated requiring IV fluids on 12/31/2019.  Still being tapered off of oxygen for hypoxia.  Barriers to discharge: Hypoxia requiring O2 supplementation-see above  Antimicorbials  :    Anti-infectives (From admission, onward)   Start     Dose/Rate Route Frequency Ordered Stop   12/23/19 1000  remdesivir 100 mg in sodium chloride 0.9 % 100 mL IVPB  Status:  Discontinued     100 mg 200 mL/hr over 30 Minutes Intravenous Daily 12/21/19 2231 12/21/19 2246   12/22/19 1000  remdesivir 100 mg in sodium chloride 0.9 % 100 mL IVPB     100 mg 200 mL/hr over 30 Minutes Intravenous Daily 12/21/19 2248 12/25/19 0937   12/22/19 0900  remdesivir 200 mg in sodium chloride 0.9% 250 mL IVPB  Status:  Discontinued     200 mg 580 mL/hr over 30 Minutes Intravenous Once 12/21/19 2231 12/21/19 2246      Inpatient Medications  Scheduled Meds:  enoxaparin (LOVENOX) injection  40 mg Subcutaneous Q24H   insulin aspart  0-20 Units Subcutaneous TID WC   insulin aspart  0-5 Units Subcutaneous QHS   insulin glargine  14 Units Subcutaneous Daily   mouth rinse  15 mL Mouth Rinse BID   sodium chloride  2 spray Each Nare BID   Continuous Infusions:  lactated ringers     PRN Meds:.acetaminophen, albuterol, alum & mag hydroxide-simeth, benzonatate, chlorpheniramine-HYDROcodone   Time Spent in minutes  25  See all Orders from today for further details   Lala Lund M.D on 12/31/2019 at 10:19 AM  To page go to [email or web address] - use universal password  [facility] -  Office  [phone]    Objective:   Vitals:   12/30/19 1227 12/30/19 1430 12/30/19 2121 12/31/19 0420  BP:   94/70 107/77  Pulse:   (!) 101 93  Resp:   18 18  Temp: 98.2 F (36.8 C) (!) 97.2 F (36.2 C) 97.9 F (36.6 C) 98 F (36.7 C)  TempSrc: Rectal Oral Oral   SpO2:   93% 92%  Weight:      Height:        Wt Readings from Last 3 Encounters:  12/30/19 84.1 kg     Intake/Output  Summary (Last 24 hours) at 12/31/2019 1019 Last data filed at 12/31/2019 0500 Gross per 24  hour  Intake 360 ml  Output 550 ml  Net -190 ml     Physical Exam  Awake Alert, No new F.N deficits, Normal affect [place].AT,PERRAL Supple Neck,No JVD, No cervical lymphadenopathy appriciated.  Symmetrical Chest wall movement, Good air movement bilaterally, CTAB RRR,No Gallops, Rubs or new Murmurs, No Parasternal Heave +ve B.Sounds, Abd Soft, No tenderness, No organomegaly appriciated, No rebound - guarding or rigidity. No Cyanosis, Clubbing or edema, No new Rash or bruise    Data Review:    CBC Recent Labs  Lab 12/27/19 0404 12/28/19 0514 12/29/19 0307 12/30/19 0243 12/31/19 0502  WBC 14.0* 14.4* 13.5* 16.6* 15.9*  HGB 18.3* 19.2* 19.1* 20.3* 19.2*  HCT 53.9* 55.3* 54.6* 57.5* 54.4*  PLT 742* 741* 501* 625* 482*  MCV 89.7 87.6 88.9 86.7 86.5  MCH 30.4 30.4 31.1 30.6 30.5  MCHC 34.0 34.7 35.0 35.3 35.3  RDW 12.7 13.2 13.2 13.0 12.9  LYMPHSABS  --   --   --  1.3 2.2  MONOABS  --   --   --  0.2 1.1*  EOSABS  --   --   --  0.0 0.1  BASOSABS  --   --   --  0.1 0.1    Chemistries  Recent Labs  Lab 12/27/19 0404 12/28/19 0514 12/29/19 0307 12/30/19 0243 12/31/19 0502  NA 134* 133* 131* 129* 129*  K 5.0 5.1 4.4 5.2* 3.2*  CL 98 97* 99 94* 93*  CO2 21* 21* 18* 20* 20*  GLUCOSE 183* 233* 155* 203* 112*  BUN 45* 47* 38* 46* 40*  CREATININE 1.21 1.24 1.12 1.43* 1.15  CALCIUM 9.0 9.4 9.0 9.2 8.6*  MG  --   --  2.2 2.4 2.3  AST 27 31 37 37 43*  ALT 36 41 43 60* 60*  ALKPHOS 157* 164* 146* 163* 143*  BILITOT 1.5* 1.6* 2.0* 2.5* 2.6*   ------------------------------------------------------------------------------------------------------------------ No results for input(s): CHOL, HDL, LDLCALC, TRIG, CHOLHDL, LDLDIRECT in the last 72 hours.  Lab Results  Component Value Date   HGBA1C 8.3 (H) 12/21/2019    ------------------------------------------------------------------------------------------------------------------ No results for input(s): TSH, T4TOTAL, T3FREE, THYROIDAB in the last 72 hours.  Invalid input(s): FREET3 ------------------------------------------------------------------------------------------------------------------ Recent Labs    12/29/19 0307  FERRITIN 1,213*    Coagulation profile No results for input(s): INR, PROTIME in the last 168 hours.  Recent Labs    12/30/19 0415 12/31/19 0502  DDIMER 1.05* 0.65*    Cardiac Enzymes No results for input(s): CKMB, TROPONINI, MYOGLOBIN in the last 168 hours.  Invalid input(s): CK ------------------------------------------------------------------------------------------------------------------    Component Value Date/Time   BNP 33.5 12/31/2019 0502    Micro Results Recent Results (from the past 240 hour(s))  Culture, blood (routine x 2)     Status: None   Collection Time: 12/21/19 11:18 PM   Specimen: BLOOD RIGHT HAND  Result Value Ref Range Status   Specimen Description BLOOD RIGHT HAND  Final   Special Requests   Final    BOTTLES DRAWN AEROBIC AND ANAEROBIC Blood Culture adequate volume   Culture   Final    NO GROWTH 5 DAYS Performed at [facility], [address], [place], [place] [address]    Report Status 12/27/2019 FINAL  Final  Culture, blood (routine x 2)     Status: None   Collection Time: 12/21/19 11:22 PM   Specimen: BLOOD LEFT HAND  Result Value Ref Range Status   Specimen Description BLOOD LEFT HAND  Final  Special Requests AEROBIC BOTTLE ONLY Blood Culture adequate volume  Final   Culture   Final    NO GROWTH 5 DAYS Performed at [facility], [address] [place], [place] [address]    Report Status 12/27/2019 FINAL  Final  MRSA PCR Screening     Status: None   Collection Time: 12/25/19  4:26 AM   Specimen: Nasal Mucosa; Nasopharyngeal  Result Value Ref Range Status    MRSA by PCR NEGATIVE NEGATIVE Final    Comment:        The GeneXpert MRSA Assay (FDA approved for NASAL specimens only), is one component of a comprehensive MRSA colonization surveillance program. It is not intended to diagnose MRSA infection nor to guide or monitor treatment for MRSA infections. Performed at [facility], [address], [place], [place] [address]   Culture, blood (routine x 2)     Status: None (Preliminary result)   Collection Time: 12/29/19 12:52 PM   Specimen: BLOOD  Result Value Ref Range Status   Specimen Description BLOOD RIGHT ANTECUBITAL  Final   Special Requests   Final    AEROBIC BOTTLE ONLY Blood Culture results may not be optimal due to an inadequate volume of blood received in culture bottles   Culture NO GROWTH 2 DAYS  Final   Report Status PENDING  Incomplete  Culture, blood (routine x 2)     Status: None (Preliminary result)   Collection Time: 12/29/19  1:00 PM   Specimen: BLOOD RIGHT HAND  Result Value Ref Range Status   Specimen Description BLOOD RIGHT HAND  Final   Special Requests   Final    BOTTLES DRAWN AEROBIC AND ANAEROBIC Blood Culture adequate volume   Culture NO GROWTH 2 DAYS  Final   Report Status PENDING  Incomplete    Radiology Reports CT CHEST W CONTRAST  Result Date: 12/29/2019 CLINICAL DATA:  Hemoptysis. COVID EXAM: CT CHEST WITH CONTRAST TECHNIQUE: Multidetector CT imaging of the chest was performed during intravenous contrast administration. CONTRAST:  75mL OMNIPAQUE IOHEXOL 300 MG/ML  SOLN COMPARISON:  None. FINDINGS: Cardiovascular: Heart size normal. No pericardial effusion. Coronary calcifications. Aortic Atherosclerosis (ICD10-170.0). Mediastinum/Nodes: Enlarged AP window and right paratracheal nodes up to 1.5 cm short axis diameter. No definite hilar adenopathy. Lungs/Pleura: No pleural effusion. No pneumothorax. Bronchiectasis posteriorly in both lower lobes. Coarse subpleural interstitial thickening in both lungs.  Patchy ground-glass opacities scattered throughout most lungs predominantly in the periphery. Upper Abdomen: No acute findings Musculoskeletal: Lower cervical spondylitic changes. No fracture or worrisome bone lesion. IMPRESSION: 1. Patchy ground-glass opacities in the periphery of both lungs, nonspecific. Differential diagnosis includes atypical pneumonia, hypersensitivity pneumonitis, and alveolar hemorrhage. 2. Mediastinal adenopathy, possibly reactive but nonspecific. 3. Coronary and Aortic Atherosclerosis (ICD10-170.0). 4. Coarse subpleural interstitial thickening in both lungs suggesting interstitial lung disease, with bibasilar bronchiectasis. Electronically Signed   By: Corlis Leak M.D.   On: 12/29/2019 14:48   DG Chest Port 1 View  Result Date: 12/30/2019 CLINICAL DATA:  Shortness of breath, COVID EXAM: PORTABLE CHEST 1 VIEW COMPARISON:  December 29, 2019 FINDINGS: The mediastinal contour and cardiac silhouette are normal. Increased pulmonary interstitium and hazy ground-glass opacities are identified throughout both lungs unchanged. There is no pleural effusion. No acute osseous abnormality is identified. IMPRESSION: Increased pulmonary interstitium and hazy ground-glass opacities throughout both lungs unchanged compared to prior exam. Electronically Signed   By: Sherian Rein M.D.   On: 12/30/2019 08:30   DG Chest Port 1  View  Result Date: 12/29/2019 CLINICAL DATA:  SOB, center chest tightness.  Covid-19 positive. EXAM: PORTABLE CHEST - 1 VIEW COMPARISON:  12/27/2019 FINDINGS: Low volumes. Slight worsening of coarse peripheral interstitial and airspace opacities, involving bases more than apices, right greater than left. Heart size and mediastinal contours are within normal limits. No effusion. No pneumothorax. Visualized bones unremarkable. IMPRESSION: Worsening asymmetric infiltrates. Electronically Signed   By: Corlis Leak M.D.   On: 12/29/2019 12:57   DG Chest Port 1 View  Result Date:  12/27/2019 CLINICAL DATA:  COVID, cough EXAM: PORTABLE CHEST 1 VIEW COMPARISON:  12/24/2019 FINDINGS: Interstitial prominence again noted throughout the lungs compatible with chronic lung disease/fibrosis. No acute confluent consolidation. No effusions or pneumothorax. Heart is normal size. No acute bony abnormality. IMPRESSION: Stable chronic changes.  No definite acute cardiopulmonary process. Electronically Signed   By: Charlett Nose M.D.   On: 12/27/2019 19:15   DG CHEST PORT 1 VIEW  Result Date: 12/24/2019 CLINICAL DATA:  Shortness of breath EXAM: PORTABLE CHEST 1 VIEW COMPARISON:  12/23/2019 FINDINGS: Chronic interstitial opacities compatible with chronic lung disease/fibrosis. Pulse heart is normal size. No effusions. No acute bony abnormality. IMPRESSION: Interstitial prominence throughout the lungs, likely chronic/fibrosis. Electronically Signed   By: Charlett Nose M.D.   On: 12/24/2019 23:37   DG Chest Port 1 View  Result Date: 12/23/2019 CLINICAL DATA:  Respiratory failure.  COVID-19. EXAM: PORTABLE CHEST 1 VIEW COMPARISON:  December 22, 2019 FINDINGS: Bilateral pulmonary infiltrates are stable. The cardiomediastinal silhouette is stable. No pneumothorax. No other acute abnormalities. IMPRESSION: Stable bilateral pulmonary infiltrates consistent with history. Electronically Signed   By: Gerome Sam III M.D   On: 12/23/2019 08:06   DG Chest Port 1 View  Result Date: 12/22/2019 CLINICAL DATA:  COVID-19 pneumonia. EXAM: PORTABLE CHEST 1 VIEW COMPARISON:  December 21, 2019. FINDINGS: The heart size and mediastinal contours are within normal limits. No pneumothorax or pleural effusion is noted. Stable right lung opacity is noted consistent with pneumonia. Mild left basilar atelectasis or infiltrate is noted. The visualized skeletal structures are unremarkable. IMPRESSION: Stable right lung opacity consistent with pneumonia. Mild left basilar atelectasis or infiltrate is noted. Electronically Signed    By: Lupita Raider M.D.   On: 12/22/2019 08:46   ECHOCARDIOGRAM COMPLETE  Result Date: 12/23/2019    ECHOCARDIOGRAM REPORT   Patient Name:   Theodore Kim Date of Exam: 12/22/2019 Medical Rec #:  [id number]    Height:       70.0 in Accession #:    [id number]   Weight:       212.5 lb Date of Birth:  08-10-1956    BSA:          2.142 m Patient Age:    63 years     BP:           131/87 mmHg Patient Gender: M            HR:           83 bpm. Exam Location:  Inpatient Procedure: 2D Echo, Color Doppler, Cardiac Doppler and Intracardiac            Opacification Agent Indications:    R06.02 SOB  History:        Patient has no prior history of Echocardiogram examinations.                 CV19.  Sonographer:    Roosvelt Maser RDCS Referring Phys: [id number] Ivor Costa  MEIER  Sonographer Comments: Technically difficult study due to poor echo windows. IMPRESSIONS  1. Left ventricular ejection fraction, by estimation, is 55 to 60%. The left ventricle has normal function. The left ventricle has no regional wall motion abnormalities. There is mild left ventricular hypertrophy. Left ventricular diastolic parameters are consistent with Grade I diastolic dysfunction (impaired relaxation).  2. Right ventricular systolic function is normal. The right ventricular size is normal. Tricuspid regurgitation signal is inadequate for assessing PA pressure.  3. The mitral valve is abnormal. No evidence of mitral valve regurgitation. No evidence of mitral stenosis.  4. Due to suboptimal acoustic windows, cannot exclude a small mobile echodensity on the aortic valve with independent motion. Best seen in clip 36. This might represent a calcification with artifact, cannot exclude mobile calcification or vegetation. The aortic valve is abnormal. Aortic valve regurgitation is trivial. No aortic stenosis is present. FINDINGS  Left Ventricle: Left ventricular ejection fraction, by estimation, is 55 to 60%. The left ventricle has normal function. The left  ventricle has no regional wall motion abnormalities. Definity contrast agent was given IV to delineate the left ventricular  endocardial borders. The left ventricular internal cavity size was normal in size. There is mild left ventricular hypertrophy. Left ventricular diastolic parameters are consistent with Grade I diastolic dysfunction (impaired relaxation). Right Ventricle: The right ventricular size is normal. No increase in right ventricular wall thickness. Right ventricular systolic function is normal. Tricuspid regurgitation signal is inadequate for assessing PA pressure. Left Atrium: Left atrial size was normal in size. Right Atrium: Right atrial size was normal in size. Pericardium: There is no evidence of pericardial effusion. Mitral Valve: The mitral valve is abnormal. Normal mobility of the mitral valve leaflets. Mild mitral annular calcification. No evidence of mitral valve regurgitation. No evidence of mitral valve stenosis. Tricuspid Valve: The tricuspid valve is normal in structure. Tricuspid valve regurgitation is trivial. No evidence of tricuspid stenosis. Aortic Valve: Due to suboptimal acoustic windows, cannot exclude a small mobile echodensity on the aortic valve with independent motion. Best seen in clip 36. This might represent a calcification with artifact, cannot exclude mobile calcification or vegetation. The aortic valve is abnormal. Aortic valve regurgitation is trivial. No aortic stenosis is present. There is mild calcification of the aortic valve. Pulmonic Valve: The pulmonic valve was not well visualized. Pulmonic valve regurgitation is trivial. No evidence of pulmonic stenosis. Aorta: The aortic root is normal in size and structure. Venous: The inferior vena cava was not well visualized. IAS/Shunts: The interatrial septum was not well visualized.  LEFT VENTRICLE PLAX 2D LVIDd:         3.30 cm     Diastology LVIDs:         2.30 cm     LV e' lateral:   12.30 cm/s LV PW:         1.20 cm      LV E/e' lateral: 5.5 LV IVS:        1.20 cm     LV e' medial:    6.42 cm/s                            LV E/e' medial:  10.6  LV Volumes (MOD) LV vol d, MOD A4C: 62.7 ml LV vol s, MOD A4C: 26.9 ml LV SV MOD A4C:     62.7 ml RIGHT VENTRICLE RV Basal diam:  3.50 cm RV S prime:  14.30 cm/s TAPSE (M-mode): 1.9 cm LEFT ATRIUM             Index       RIGHT ATRIUM           Index LA diam:        3.10 cm 1.45 cm/m  RA Area:     18.70 cm LA Vol (A2C):   56.8 ml 26.52 ml/m RA Volume:   46.90 ml  21.90 ml/m LA Vol (A4C):   48.4 ml 22.60 ml/m LA Biplane Vol: 55.3 ml 25.82 ml/m  AORTIC VALVE LVOT Vmax:   77.70 cm/s LVOT Vmean:  49.400 cm/s LVOT VTI:    0.144 m  AORTA Ao Root diam: 2.70 cm MITRAL VALVE MV Area (PHT): 3.08 cm    SHUNTS MV Decel Time: 246 msec    Systemic VTI: 0.14 m MV E velocity: 68.10 cm/s MV A velocity: 79.30 cm/s MV E/A ratio:  0.86 Weston Brass MD Electronically signed by Weston Brass MD Signature Date/Time: 12/23/2019/10:12:24 AM    Final    ECHOCARDIOGRAM LIMITED  Result Date: 12/30/2019    ECHOCARDIOGRAM LIMITED REPORT   Patient Name:   Theodore Kim Date of Exam: 12/30/2019 Medical Rec #:  [id number]    Height:       70.0 in Accession #:    [id number]   Weight:       185.4 lb Date of Birth:  Apr 07, 1956    BSA:          2.021 m Patient Age:    63 years     BP:           99/86 mmHg Patient Gender: M            HR:           96 bpm. Exam Location:  Inpatient Procedure: Limited Echo, Limited Color Doppler and Cardiac Doppler Indications:    acute diastolic CHF 428.31  History:        Patient has prior history of Echocardiogram examinations, most                 recent 12/22/2019. Covid.  Sonographer:    Delcie Roch Referring Phys: Heide Scales [facility]  Sonographer Comments: Technically difficult study due to poor echo windows. Image acquisition challenging due to respiratory motion. IMPRESSIONS  1. Technically difficult echo with poor image quality.  2. Left ventricular ejection  fraction, by estimation, is 55 to 60%. The left ventricle has normal function.  3. The aortic valve is grossly normal. Aortic valve regurgitation is not visualized. No aortic stenosis is present. FINDINGS  Left Ventricle: Left ventricular ejection fraction, by estimation, is 55 to 60%. The left ventricle has normal function. Aortic Valve: The aortic valve is grossly normal. Aortic valve regurgitation is not visualized. No aortic stenosis is present. Aortic valve mean gradient measures 2.5 mmHg. Aortic valve peak gradient measures 4.4 mmHg. Aortic valve area, by VTI measures 1.79 cm. Additional Comments: Technically difficult echo with poor image quality.  LEFT VENTRICLE PLAX 2D LVOT diam:     1.80 cm LV SV:         27 LV SV Index:   13 LVOT Area:     2.54 cm  AORTIC VALVE AV Area (Vmax):    1.80 cm AV Area (Vmean):   1.74 cm AV Area (VTI):     1.79 cm AV Vmax:           104.50 cm/s AV Vmean:  71.100 cm/s AV VTI:            0.151 m AV Peak Grad:      4.4 mmHg AV Mean Grad:      2.5 mmHg LVOT Vmax:         73.80 cm/s LVOT Vmean:        48.500 cm/s LVOT VTI:          0.106 m LVOT/AV VTI ratio: 0.70  AORTA Ao Root diam: 3.40 cm  SHUNTS Systemic VTI:  0.11 m Systemic Diam: 1.80 cm Kristeen Miss MD Electronically signed by Kristeen Miss MD Signature Date/Time: 12/30/2019/2:43:44 PM    Final

## 2019-12-31 NOTE — Progress Notes (Signed)
Occupational Therapy Treatment Patient Details Name: Theodore Kim MRN: [id number] DOB: May 24, 1956 Today's Date: 12/31/2019    History of present illness 64 year old male admitted 12/21/19, transferred from [facility], to ICU for worsening hypoxemia. Patient was diagnosed with COVID on 12/19/19. PMH: DM2   OT comments  Pt progressing with OT goals with significant improvement in O2 needs, as pt received on 1 L O2 (96-100% at rest) in bed. With some encouragement, pt more engaged in therapy session today. Increased pt to 2 L O2 for activity. Modified Independent to sit EOB ranging from 92-99% sitting EOB. Pt Supervision to don socks sitting EOB with increased time. Pt denied need for toileting task. Pt Supervision for sit to stand at bedside with RW and short distance mobility around foot of bed to chair, no overt LOB noted. Pt with delayed desats reading, but suspect pt desat to 85% with good pleth, recovered quickly to 90s (within 30 seconds) with seated rest break. Encouraged use of flutter valve/incentive spirometer. Based on current functional abilities today, recommend HHOT at DC with 24/7 supervision/assistance.  Will continue to follow acutely.   Follow Up Recommendations  Home health OT;Supervision/Assistance - 24 hour    Equipment Recommendations  3 in 1 bedside commode    Recommendations for Other Services      Precautions / Restrictions Precautions Precautions: Fall;Other (comment) Precaution Comments: monitor oxygen saturation Restrictions Weight Bearing Restrictions: No       Mobility Bed Mobility Overal bed mobility: Modified Independent Bed Mobility: Supine to Sit              Transfers Overall transfer level: Needs assistance Equipment used: Rolling walker (2 wheeled) Transfers: Sit to/from [employer] Transfers Sit to Stand: Supervision Stand pivot transfers: Supervision       General transfer comment: Supervision for transfers, no overt  LOB    Balance                                           ADL either performed or assessed with clinical judgement   ADL Overall ADL's : Needs assistance/impaired                     Lower Body Dressing: Supervision/safety;Sitting/lateral leans Lower Body Dressing Details (indicate cue type and reason): Supervision to don socks sitting EOB             Functional mobility during ADLs: Supervision/safety;Rolling walker General ADL Comments: Continues with decreased activity tolerance, but much improved since last session     Vision       Perception     Praxis      Cognition Arousal/Alertness: Awake/alert Behavior During Therapy: WFL for tasks assessed/performed Overall Cognitive Status: Within Functional Limits for tasks assessed                                          Exercises Other Exercises Other Exercises: pursed lip breathing   Shoulder Instructions       General Comments Pt received on 1 L O2 96-100% at rest, 92% sitting EOB increasing to 99% when on 2 L O2. Desats with delayed O2 reading around 85% after short distance ambulation, recovered within 30 seconds to 90s, 93% at rest on 1 L O2  Pertinent Vitals/ Pain       Pain Assessment: No/denies pain  Home Living                                          Prior Functioning/Environment              Frequency  Min 3X/week        Progress Toward Goals  OT Goals(current goals can now be found in the care plan section)  Progress towards OT goals: Progressing toward goals  Acute Rehab OT Goals Patient Stated Goal: go home OT Goal Formulation: With patient Time For Goal Achievement: 01/08/20 Potential to Achieve Goals: Good ADL Goals Pt Will Perform Grooming: with modified independence;standing Pt Will Perform Upper Body Bathing: with modified independence;sitting Pt Will Perform Lower Body Bathing: with modified independence;sit  to/from stand Pt Will Perform Lower Body Dressing: with modified independence;sitting/lateral leans;sit to/from stand Pt Will Transfer to Toilet: with modified independence;ambulating;regular height toilet Pt Will Perform Toileting - Clothing Manipulation and hygiene: with modified independence;sitting/lateral leans;sit to/from stand Additional ADL Goal #1: Pt will verbalize at least 3 energy conservation strategies to implement during ADLs in order to maximize independence.  Plan Discharge plan needs to be updated    Co-evaluation                 AM-PAC OT "6 Clicks" Daily Activity     Outcome Measure   Help from another person eating meals?: None Help from another person taking care of personal grooming?: A Little Help from another person toileting, which includes using toliet, bedpan, or urinal?: A Little Help from another person bathing (including washing, rinsing, drying)?: A Lot Help from another person to put on and taking off regular upper body clothing?: A Little Help from another person to put on and taking off regular lower body clothing?: A Little 6 Click Score: 18    End of Session Equipment Utilized During Treatment: Gait belt;Rolling walker;Oxygen  OT Visit Diagnosis: Unsteadiness on feet (R26.81);Other abnormalities of gait and mobility (R26.89);Muscle weakness (generalized) (M62.81)   Activity Tolerance Patient tolerated treatment well   Patient Left in chair;with call bell/phone within reach;Other (comment)(with PT)   Nurse Communication          Time: [phone] OT Time Calculation (min): 24 min  Charges: OT General Charges $OT Visit: 1 Visit OT Treatments $Self Care/Home Management : 8-22 mins $Therapeutic Activity: 8-22 mins  Layla Maw, OTR/L   Layla Maw 12/31/2019, 3:31 PM

## 2020-01-01 LAB — BASIC METABOLIC PANEL
Anion gap: 12 (ref 5–15)
BUN: 29 mg/dL — ABNORMAL HIGH (ref 8–23)
CO2: 26 mmol/L (ref 22–32)
Calcium: 8.5 mg/dL — ABNORMAL LOW (ref 8.9–10.3)
Chloride: 95 mmol/L — ABNORMAL LOW (ref 98–111)
Creatinine, Ser: 1.22 mg/dL (ref 0.61–1.24)
GFR calc Af Amer: >60 mL/min (ref >60)
GFR calc non Af Amer: >60 mL/min (ref >60)
Glucose, Bld: 97 mg/dL (ref 70–99)
Potassium: 3.7 mmol/L (ref 3.5–5.1)
Sodium: 133 mmol/L — ABNORMAL LOW (ref 135–145)

## 2020-01-01 LAB — CBC WITH DIFFERENTIAL/PLATELET
Abs Immature Granulocytes: 0.08 10*3/uL — ABNORMAL HIGH (ref 0.00–0.07)
Basophils Absolute: 0.1 10*3/uL (ref 0.0–0.1)
Basophils Relative: 1 %
Eosinophils Absolute: 0.2 10*3/uL (ref 0.0–0.5)
Eosinophils Relative: 1 %
HCT: 51.3 % (ref 39.0–52.0)
Hemoglobin: 17.9 g/dL — ABNORMAL HIGH (ref 13.0–17.0)
Immature Granulocytes: 1 %
Lymphocytes Relative: 19 %
Lymphs Abs: 2.7 10*3/uL (ref 0.7–4.0)
MCH: 30.7 pg (ref 26.0–34.0)
MCHC: 34.9 g/dL (ref 30.0–36.0)
MCV: 88 fL (ref 80.0–100.0)
Monocytes Absolute: 1.1 10*3/uL — ABNORMAL HIGH (ref 0.1–1.0)
Monocytes Relative: 8 %
Neutro Abs: 10.4 10*3/uL — ABNORMAL HIGH (ref 1.7–7.7)
Neutrophils Relative %: 70 %
Platelets: 383 10*3/uL (ref 150–400)
RBC: 5.83 MIL/uL — ABNORMAL HIGH (ref 4.22–5.81)
RDW: 12.9 % (ref 11.5–15.5)
WBC: 14.6 10*3/uL — ABNORMAL HIGH (ref 4.0–10.5)
nRBC: 0 % (ref 0.0–0.2)

## 2020-01-01 LAB — GLUCOSE, CAPILLARY
Glucose-Capillary: 147 mg/dL — ABNORMAL HIGH (ref 70–99)
Glucose-Capillary: 239 mg/dL — ABNORMAL HIGH (ref 70–99)
Glucose-Capillary: 80 mg/dL (ref 70–99)
Glucose-Capillary: 97 mg/dL (ref 70–99)

## 2020-01-01 MED ORDER — LACTATED RINGERS IV SOLN: INTRAVENOUS | Status: AC

## 2020-01-01 NOTE — Progress Notes (Signed)
PROGRESS NOTE                                                                                                                                                                                                             Patient Demographics:    Theodore Kim, is a 64 y.o. male, DOB - [birth year], ZJI:[id number]  Outpatient Primary MD for the patient is No primary care provider on file.   Admit date - 12/21/2019   LOS - 11  No chief complaint on file.      Brief Narrative: Patient is a 64 y.o. male with PMHx of DM-2, HTN who was diagnosed with COVID-19 on 3/17-transferred from [facility] to [facility] ICU for worsening hypoxemia.  He was managed in the ICU-and upon stability transfer to the [facility].  See below for further details.  Significant Events: 3/19>> admit to [facility], ICU for worsening hypoxemia-initially requiring 15 L of HFNC 3/20>> TTE EF 55-60% 3/22>> transferred to [facility]  COVID-19 medications: Remdesivir: 3/19>>3/23 Steroids: 3/19>> Actemra: 3/19 x 1  Microbiology data: 3/19: Blood culture: Negative  DVT prophylaxis: Prophylactic Lovenox  Procedures: None  Consults: PCCM   Subjective:   Patient in bed, appears comfortable, denies any headache, no fever, no chest pain or pressure, no shortness of breath , no abdominal pain. No focal weakness.    Assessment  & Plan :   Acute Hypoxic Resp Failure due to Covid 19 Viral pneumonia: He had severe disease with extreme hypoxia requiring 15 L high flow oxygen, he was treated appropriately with IV Actemra, remdesivir and steroids, oxygen requirements down to 2 L.  Encouraged to sit up, advance activity and titrate down oxygen.  Also encouraged to use I-S and flutter valve for pulmonary toiletry.  Continue to monitor closely.  Clinically improving.      Recent Labs  Lab 12/28/19 0514 12/29/19 0307 12/30/19 0243 12/31/19 0502  01/01/20 0300  WBC 14.4* 13.5* 16.6* 15.9* 14.6*  HGB 19.2* 19.1* 20.3* 19.2* 17.9*  HCT 55.3* 54.6* 57.5* 54.4* 51.3  PLT 741* 501* 625* 482* 383  MCV 87.6 88.9 86.7 86.5 88.0  MCH 30.4 31.1 30.6 30.5 30.7  MCHC 34.7 35.0 35.3 35.3 34.9  RDW 13.2 13.2 13.0 12.9 12.9  LYMPHSABS  --   --  1.3 2.2 2.7  MONOABS  --   --  0.2 1.1* 1.1*  EOSABS  --   --  0.0 0.1 0.2  BASOSABS  --   --  0.1 0.1 0.1    Recent Labs  Lab 12/26/19 0314 12/26/19 0314 12/27/19 0404 12/27/19 0404 12/28/19 0514 12/29/19 0307 12/30/19 0243 12/30/19 0415 12/31/19 0502 01/01/20 0300  NA 136   < > 134*   < > 133* 131* 129*  --  129* 133*  K 5.2*   < > 5.0   < > 5.1 4.4 5.2*  --  3.2* 3.7  CL 102   < > 98   < > 97* 99 94*  --  93* 95*  CO2 18*   < > 21*   < > 21* 18* 20*  --  20* 26  GLUCOSE 200*   < > 183*   < > 233* 155* 203*  --  112* 97  BUN 40*   < > 45*   < > 47* 38* 46*  --  40* 29*  CREATININE 1.20   < > 1.21   < > 1.24 1.12 1.43*  --  1.15 1.22  CALCIUM 9.0   < > 9.0   < > 9.4 9.0 9.2  --  8.6* 8.5*  AST 27   < > 27  --  31 37 37  --  43*  --   ALT 38   < > 36  --  41 43 60*  --  60*  --   ALKPHOS 155*   < > 157*  --  164* 146* 163*  --  143*  --   BILITOT 1.5*   < > 1.5*  --  1.6* 2.0* 2.5*  --  2.6*  --   ALBUMIN 2.8*   < > 2.8*  --  3.0* 3.0* 3.2*  --  2.9*  --   MG  --   --   --   --   --  2.2 2.4  --  2.3  --   CRP 1.1*   < > 0.7  --  0.6 0.6 0.6  --  <0.5  --   DDIMER 2.74*  --  1.74*  --   --  0.89*  --  1.05* 0.65*  --   PROCALCITON  --   --   --   --   --  <0.10 <0.10  --  0.21  --   BNP 18.2  --   --   --   --  175.2* 179.8*  --  33.5  --    < > = values in this interval not displayed.    Recent Labs  Lab 12/26/19 0314 12/26/19 0314 12/27/19 0404 12/28/19 0514 12/29/19 0307 12/30/19 0243 12/30/19 0415 12/31/19 0502  CRP 1.1*   < > 0.7 0.6 0.6 0.6  --  <0.5  DDIMER 2.74*  --  1.74*  --  0.89*  --  1.05* 0.65*  BNP 18.2  --   --   --  175.2* 179.8*  --  33.5  PROCALCITON   --   --   --   --  <0.10 <0.10  --  0.21   < > = values in this interval not displayed.        Prone/Incentive Spirometry: encouraged incentive spirometry/flutter use 3-4/hour.  Hemoptysis: 1 episode on 3/25-appears to be mild-none since then.  Stopped aspirin-changed Lovenox back to daily dosing.  Chest x-ray without any major findings on 3/25.  Stable CT angiogram chest..  Worsening  polycythemia:   Note upon admission his hemoglobin was around 14.8, erythropoietin level was 6.9, case was discussed with Dr. Myna Hidalgo couple of times, his acute worsening could have been due to dehydration and stress reaction, with IV fluids hemoglobin levels trending down, post discharge will follow with PCP and if needed with Dr. Myna Hidalgo.  HTN: BP controlled-hold amlodipine-as BP stable-in therapeutic phlebotomy will be performed.  Possible aortic valve density noted on echocardiogram done in the ICU on 12/24/2019. Clinically no signs of bacterial infection or bacteremia, repeat echocardiogram limited done on 12/30/2019 shows normal aortic valve.  Negative blood cultures and stable procalcitonin.  Dehydration with hyponatremia and hypokalemia.  Improved with IV fluids, continue to replace fluids IV on 01/01/2020.  DM-2 (A1c 8.3 on 12/21/2019) poor outpatient control due to hyperglycemia  : Lantus and sliding scale.  Monitor and adjust.  CBG (last 3)  Recent Labs    12/31/19 1616 12/31/19 2125 01/01/20 0803  GLUCAP 252* 91 147*     Condition - Guarded  Family Communication  :  Spouse Lupita Leash [phone]  updated over the phone 12/29/19, 12/30/19, 12/31/19, 01/01/20  Code Status :  Full Code  Diet :  Diet Order            Diet heart healthy/carb modified Room service appropriate? Yes; Fluid consistency: Thin  Diet effective now               Disposition Plan  : Still dehydrated requiring IV fluids while a 24 hours most likely home with home health PT and home oxygen on 01/02/2020.  Barriers to  discharge: Hypoxia requiring O2 supplementation-see above  Antimicorbials  :    Anti-infectives (From admission, onward)   Start     Dose/Rate Route Frequency Ordered Stop   12/23/19 1000  remdesivir 100 mg in sodium chloride 0.9 % 100 mL IVPB  Status:  Discontinued     100 mg 200 mL/hr over 30 Minutes Intravenous Daily 12/21/19 2231 12/21/19 2246   12/22/19 1000  remdesivir 100 mg in sodium chloride 0.9 % 100 mL IVPB     100 mg 200 mL/hr over 30 Minutes Intravenous Daily 12/21/19 2248 12/25/19 0937   12/22/19 0900  remdesivir 200 mg in sodium chloride 0.9% 250 mL IVPB  Status:  Discontinued     200 mg 580 mL/hr over 30 Minutes Intravenous Once 12/21/19 2231 12/21/19 2246      Inpatient Medications  Scheduled Meds: . enoxaparin (LOVENOX) injection  40 mg Subcutaneous Q24H  . insulin aspart  0-20 Units Subcutaneous TID WC  . insulin aspart  0-5 Units Subcutaneous QHS  . insulin glargine  14 Units Subcutaneous Daily  . mouth rinse  15 mL Mouth Rinse BID  . sodium chloride  2 spray Each Nare BID   Continuous Infusions: . lactated ringers     PRN Meds:.acetaminophen, albuterol, alum & mag hydroxide-simeth, benzonatate, chlorpheniramine-HYDROcodone   Time Spent in minutes  25  See all Orders from today for further details   Susa Raring M.D on 01/01/2020 at 9:10 AM  To page go to [email or web address] - use universal password  [facility] -  Office  [phone]    Objective:   Vitals:   12/31/19 2122 12/31/19 2124 01/01/20 0512 01/01/20 0805  BP: 95/75 102/77 102/69 101/85  Pulse: 96 73 86 97  Resp: Temp: 97.6 F (36.4 C)  97.7 F (36.5 C) (!) 97.1 F (36.2 C)  TempSrc: Oral  Oral Oral  SpO2: 97%  92% 100%  Weight:      Height:        Wt Readings from Last 3 Encounters:  12/30/19 84.1 kg     Intake/Output Summary (Last 24 hours) at 01/01/2020 0910 Last data filed at 01/01/2020 0700 Gross per 24 hour  Intake --  Output 950 ml  Net -950 ml      Physical Exam  Awake Alert, No new F.N deficits, Normal affect [place].AT,PERRAL Supple Neck,No JVD, No cervical lymphadenopathy appriciated.  Symmetrical Chest wall movement, Good air movement bilaterally, CTAB RRR,No Gallops, Rubs or new Murmurs, No Parasternal Heave +ve B.Sounds, Abd Soft, No tenderness, No organomegaly appriciated, No rebound - guarding or rigidity. No Cyanosis, Clubbing or edema, No new Rash or bruise    Data Review:    CBC Recent Labs  Lab 12/28/19 0514 12/29/19 0307 12/30/19 0243 12/31/19 0502 01/01/20 0300  WBC 14.4* 13.5* 16.6* 15.9* 14.6*  HGB 19.2* 19.1* 20.3* 19.2* 17.9*  HCT 55.3* 54.6* 57.5* 54.4* 51.3  PLT 741* 501* 625* 482* 383  MCV 87.6 88.9 86.7 86.5 88.0  MCH 30.4 31.1 30.6 30.5 30.7  MCHC 34.7 35.0 35.3 35.3 34.9  RDW 13.2 13.2 13.0 12.9 12.9  LYMPHSABS  --   --  1.3 2.2 2.7  MONOABS  --   --  0.2 1.1* 1.1*  EOSABS  --   --  0.0 0.1 0.2  BASOSABS  --   --  0.1 0.1 0.1    Chemistries  Recent Labs  Lab 12/27/19 0404 12/27/19 0404 12/28/19 0514 12/29/19 0307 12/30/19 0243 12/31/19 0502 01/01/20 0300  NA 134*   < > 133* 131* 129* 129* 133*  K 5.0   < > 5.1 4.4 5.2* 3.2* 3.7  CL 98   < > 97* 99 94* 93* 95*  CO2 21*   < > 21* 18* 20* 20* 26  GLUCOSE 183*   < > 233* 155* 203* 112* 97  BUN 45*   < > 47* 38* 46* 40* 29*  CREATININE 1.21   < > 1.24 1.12 1.43* 1.15 1.22  CALCIUM 9.0   < > 9.4 9.0 9.2 8.6* 8.5*  MG  --   --   --  2.2 2.4 2.3  --   AST 27  --  31 37 37 43*  --   ALT 36  --  41 43 60* 60*  --   ALKPHOS 157*  --  164* 146* 163* 143*  --   BILITOT 1.5*  --  1.6* 2.0* 2.5* 2.6*  --    < > = values in this interval not displayed.   ------------------------------------------------------------------------------------------------------------------ No results for input(s): CHOL, HDL, LDLCALC, TRIG, CHOLHDL, LDLDIRECT in the last 72 hours.  Lab Results  Component Value Date   HGBA1C 8.3 (H) 12/21/2019    ------------------------------------------------------------------------------------------------------------------ No results for input(s): TSH, T4TOTAL, T3FREE, THYROIDAB in the last 72 hours.  Invalid input(s): FREET3 ------------------------------------------------------------------------------------------------------------------ No results for input(s): VITAMINB12, FOLATE, FERRITIN, TIBC, IRON, RETICCTPCT in the last 72 hours.  Coagulation profile No results for input(s): INR, PROTIME in the last 168 hours.  Recent Labs    12/30/19 0415 12/31/19 0502  DDIMER 1.05* 0.65*    Cardiac Enzymes No results for input(s): CKMB, TROPONINI, MYOGLOBIN in the last 168 hours.  Invalid input(s): CK ------------------------------------------------------------------------------------------------------------------    Component Value Date/Time   BNP 33.5 12/31/2019 0502    Micro Results Recent Results (from the past 240 hour(s))  MRSA PCR Screening  Status: None   Collection Time: 12/25/19  4:26 AM   Specimen: Nasal Mucosa; Nasopharyngeal  Result Value Ref Range Status   MRSA by PCR NEGATIVE NEGATIVE Final    Comment:        The GeneXpert MRSA Assay (FDA approved for NASAL specimens only), is one component of a comprehensive MRSA colonization surveillance program. It is not intended to diagnose MRSA infection nor to guide or monitor treatment for MRSA infections. Performed at [facility], [address], [place], [place] [address]   Culture, blood (routine x 2)     Status: None (Preliminary result)   Collection Time: 12/29/19 12:52 PM   Specimen: BLOOD  Result Value Ref Range Status   Specimen Description BLOOD RIGHT ANTECUBITAL  Final   Special Requests   Final    AEROBIC BOTTLE ONLY Blood Culture results may not be optimal due to an inadequate volume of blood received in culture bottles   Culture NO GROWTH 2 DAYS  Final   Report Status PENDING  Incomplete   Culture, blood (routine x 2)     Status: None (Preliminary result)   Collection Time: 12/29/19  1:00 PM   Specimen: BLOOD RIGHT HAND  Result Value Ref Range Status   Specimen Description BLOOD RIGHT HAND  Final   Special Requests   Final    BOTTLES DRAWN AEROBIC AND ANAEROBIC Blood Culture adequate volume   Culture NO GROWTH 2 DAYS  Final   Report Status PENDING  Incomplete    Radiology Reports CT CHEST W CONTRAST  Result Date: 12/29/2019 CLINICAL DATA:  Hemoptysis. COVID EXAM: CT CHEST WITH CONTRAST TECHNIQUE: Multidetector CT imaging of the chest was performed during intravenous contrast administration. CONTRAST:  2mL OMNIPAQUE IOHEXOL 300 MG/ML  SOLN COMPARISON:  None. FINDINGS: Cardiovascular: Heart size normal. No pericardial effusion. Coronary calcifications. Aortic Atherosclerosis (ICD10-170.0). Mediastinum/Nodes: Enlarged AP window and right paratracheal nodes up to 1.5 cm short axis diameter. No definite hilar adenopathy. Lungs/Pleura: No pleural effusion. No pneumothorax. Bronchiectasis posteriorly in both lower lobes. Coarse subpleural interstitial thickening in both lungs. Patchy ground-glass opacities scattered throughout most lungs predominantly in the periphery. Upper Abdomen: No acute findings Musculoskeletal: Lower cervical spondylitic changes. No fracture or worrisome bone lesion. IMPRESSION: 1. Patchy ground-glass opacities in the periphery of both lungs, nonspecific. Differential diagnosis includes atypical pneumonia, hypersensitivity pneumonitis, and alveolar hemorrhage. 2. Mediastinal adenopathy, possibly reactive but nonspecific. 3. Coronary and Aortic Atherosclerosis (ICD10-170.0). 4. Coarse subpleural interstitial thickening in both lungs suggesting interstitial lung disease, with bibasilar bronchiectasis. Electronically Signed   By: Corlis Leak M.D.   On: 12/29/2019 14:48   DG Chest Port 1 View  Result Date: 12/30/2019 CLINICAL DATA:  Shortness of breath, COVID EXAM:  PORTABLE CHEST 1 VIEW COMPARISON:  December 29, 2019 FINDINGS: The mediastinal contour and cardiac silhouette are normal. Increased pulmonary interstitium and hazy ground-glass opacities are identified throughout both lungs unchanged. There is no pleural effusion. No acute osseous abnormality is identified. IMPRESSION: Increased pulmonary interstitium and hazy ground-glass opacities throughout both lungs unchanged compared to prior exam. Electronically Signed   By: Sherian Rein M.D.   On: 12/30/2019 08:30   DG Chest Port 1 View  Result Date: 12/29/2019 CLINICAL DATA:  SOB, center chest tightness.  Covid-19 positive. EXAM: PORTABLE CHEST - 1 VIEW COMPARISON:  12/27/2019 FINDINGS: Low volumes. Slight worsening of coarse peripheral interstitial and airspace opacities, involving bases more than apices, right greater than left. Heart size and mediastinal contours are within normal limits.  No effusion. No pneumothorax. Visualized bones unremarkable. IMPRESSION: Worsening asymmetric infiltrates. Electronically Signed   By: Corlis Leak M.D.   On: 12/29/2019 12:57   DG Chest Port 1 View  Result Date: 12/27/2019 CLINICAL DATA:  COVID, cough EXAM: PORTABLE CHEST 1 VIEW COMPARISON:  12/24/2019 FINDINGS: Interstitial prominence again noted throughout the lungs compatible with chronic lung disease/fibrosis. No acute confluent consolidation. No effusions or pneumothorax. Heart is normal size. No acute bony abnormality. IMPRESSION: Stable chronic changes.  No definite acute cardiopulmonary process. Electronically Signed   By: Charlett Nose M.D.   On: 12/27/2019 19:15   DG CHEST PORT 1 VIEW  Result Date: 12/24/2019 CLINICAL DATA:  Shortness of breath EXAM: PORTABLE CHEST 1 VIEW COMPARISON:  12/23/2019 FINDINGS: Chronic interstitial opacities compatible with chronic lung disease/fibrosis. Pulse heart is normal size. No effusions. No acute bony abnormality. IMPRESSION: Interstitial prominence throughout the lungs, likely  chronic/fibrosis. Electronically Signed   By: Charlett Nose M.D.   On: 12/24/2019 23:37   DG Chest Port 1 View  Result Date: 12/23/2019 CLINICAL DATA:  Respiratory failure.  COVID-19. EXAM: PORTABLE CHEST 1 VIEW COMPARISON:  December 22, 2019 FINDINGS: Bilateral pulmonary infiltrates are stable. The cardiomediastinal silhouette is stable. No pneumothorax. No other acute abnormalities. IMPRESSION: Stable bilateral pulmonary infiltrates consistent with history. Electronically Signed   By: Gerome Sam III M.D   On: 12/23/2019 08:06   DG Chest Port 1 View  Result Date: 12/22/2019 CLINICAL DATA:  COVID-19 pneumonia. EXAM: PORTABLE CHEST 1 VIEW COMPARISON:  December 21, 2019. FINDINGS: The heart size and mediastinal contours are within normal limits. No pneumothorax or pleural effusion is noted. Stable right lung opacity is noted consistent with pneumonia. Mild left basilar atelectasis or infiltrate is noted. The visualized skeletal structures are unremarkable. IMPRESSION: Stable right lung opacity consistent with pneumonia. Mild left basilar atelectasis or infiltrate is noted. Electronically Signed   By: Lupita Raider M.D.   On: 12/22/2019 08:46   ECHOCARDIOGRAM COMPLETE  Result Date: 12/23/2019    ECHOCARDIOGRAM REPORT   Patient Name:   Theodore Kim Date of Exam: 12/22/2019 Medical Rec #:  [id number]    Height:       70.0 in Accession #:    [id number]   Weight:       212.5 lb Date of Birth:  [birth year]    BSA:          2.142 m Patient Age:    63 years     BP:           131/87 mmHg Patient Gender: M            HR:           83 bpm. Exam Location:  Inpatient Procedure: 2D Echo, Color Doppler, Cardiac Doppler and Intracardiac            Opacification Agent Indications:    R06.02 SOB  History:        Patient has no prior history of Echocardiogram examinations.                 CV19.  Sonographer:    Roosvelt Maser RDCS Referring Phys: [id number] [facility]  Sonographer Comments: Technically difficult study due to  poor echo windows. IMPRESSIONS  1. Left ventricular ejection fraction, by estimation, is 55 to 60%. The left ventricle has normal function. The left ventricle has no regional wall motion abnormalities. There is mild left ventricular hypertrophy. Left ventricular diastolic parameters are consistent with Grade  I diastolic dysfunction (impaired relaxation).  2. Right ventricular systolic function is normal. The right ventricular size is normal. Tricuspid regurgitation signal is inadequate for assessing PA pressure.  3. The mitral valve is abnormal. No evidence of mitral valve regurgitation. No evidence of mitral stenosis.  4. Due to suboptimal acoustic windows, cannot exclude a small mobile echodensity on the aortic valve with independent motion. Best seen in clip 36. This might represent a calcification with artifact, cannot exclude mobile calcification or vegetation. The aortic valve is abnormal. Aortic valve regurgitation is trivial. No aortic stenosis is present. FINDINGS  Left Ventricle: Left ventricular ejection fraction, by estimation, is 55 to 60%. The left ventricle has normal function. The left ventricle has no regional wall motion abnormalities. Definity contrast agent was given IV to delineate the left ventricular  endocardial borders. The left ventricular internal cavity size was normal in size. There is mild left ventricular hypertrophy. Left ventricular diastolic parameters are consistent with Grade I diastolic dysfunction (impaired relaxation). Right Ventricle: The right ventricular size is normal. No increase in right ventricular wall thickness. Right ventricular systolic function is normal. Tricuspid regurgitation signal is inadequate for assessing PA pressure. Left Atrium: Left atrial size was normal in size. Right Atrium: Right atrial size was normal in size. Pericardium: There is no evidence of pericardial effusion. Mitral Valve: The mitral valve is abnormal. Normal mobility of the mitral valve  leaflets. Mild mitral annular calcification. No evidence of mitral valve regurgitation. No evidence of mitral valve stenosis. Tricuspid Valve: The tricuspid valve is normal in structure. Tricuspid valve regurgitation is trivial. No evidence of tricuspid stenosis. Aortic Valve: Due to suboptimal acoustic windows, cannot exclude a small mobile echodensity on the aortic valve with independent motion. Best seen in clip 36. This might represent a calcification with artifact, cannot exclude mobile calcification or vegetation. The aortic valve is abnormal. Aortic valve regurgitation is trivial. No aortic stenosis is present. There is mild calcification of the aortic valve. Pulmonic Valve: The pulmonic valve was not well visualized. Pulmonic valve regurgitation is trivial. No evidence of pulmonic stenosis. Aorta: The aortic root is normal in size and structure. Venous: The inferior vena cava was not well visualized. IAS/Shunts: The interatrial septum was not well visualized.  LEFT VENTRICLE PLAX 2D LVIDd:         3.30 cm     Diastology LVIDs:         2.30 cm     LV e' lateral:   12.30 cm/s LV PW:         1.20 cm     LV E/e' lateral: 5.5 LV IVS:        1.20 cm     LV e' medial:    6.42 cm/s                            LV E/e' medial:  10.6  LV Volumes (MOD) LV vol d, MOD A4C: 62.7 ml LV vol s, MOD A4C: 26.9 ml LV SV MOD A4C:     62.7 ml RIGHT VENTRICLE RV Basal diam:  3.50 cm RV S prime:     14.30 cm/s TAPSE (M-mode): 1.9 cm LEFT ATRIUM             Index       RIGHT ATRIUM           Index LA diam:        3.10 cm 1.45 cm/m  RA  Area:     18.70 cm LA Vol (A2C):   56.8 ml 26.52 ml/m RA Volume:   46.90 ml  21.90 ml/m LA Vol (A4C):   48.4 ml 22.60 ml/m LA Biplane Vol: 55.3 ml 25.82 ml/m  AORTIC VALVE LVOT Vmax:   77.70 cm/s LVOT Vmean:  49.400 cm/s LVOT VTI:    0.144 m  AORTA Ao Root diam: 2.70 cm MITRAL VALVE MV Area (PHT): 3.08 cm    SHUNTS MV Decel Time: 246 msec    Systemic VTI: 0.14 m MV E velocity: 68.10 cm/s MV A  velocity: 79.30 cm/s MV E/A ratio:  0.86 Cherlynn Kaiser MD Electronically signed by Cherlynn Kaiser MD Signature Date/Time: 12/23/2019/10:12:24 AM    Final    ECHOCARDIOGRAM LIMITED  Result Date: 12/30/2019    ECHOCARDIOGRAM LIMITED REPORT   Patient Name:   Theodore Kim Date of Exam: 12/30/2019 Medical Rec #:  [id number]    Height:       70.0 in Accession #:    [id number]   Weight:       185.4 lb Date of Birth:  1956/05/13    BSA:          2.021 m Patient Age:    68 years     BP:           99/86 mmHg Patient Gender: M            HR:           96 bpm. Exam Location:  Inpatient Procedure: Limited Echo, Limited Color Doppler and Cardiac Doppler Indications:    acute diastolic CHF 026.37  History:        Patient has prior history of Echocardiogram examinations, most                 recent 12/22/2019. Covid.  Sonographer:    Johny Chess Referring Phys: Graylin Shiver [facility]  Sonographer Comments: Technically difficult study due to poor echo windows. Image acquisition challenging due to respiratory motion. IMPRESSIONS  1. Technically difficult echo with poor image quality.  2. Left ventricular ejection fraction, by estimation, is 55 to 60%. The left ventricle has normal function.  3. The aortic valve is grossly normal. Aortic valve regurgitation is not visualized. No aortic stenosis is present. FINDINGS  Left Ventricle: Left ventricular ejection fraction, by estimation, is 55 to 60%. The left ventricle has normal function. Aortic Valve: The aortic valve is grossly normal. Aortic valve regurgitation is not visualized. No aortic stenosis is present. Aortic valve mean gradient measures 2.5 mmHg. Aortic valve peak gradient measures 4.4 mmHg. Aortic valve area, by VTI measures 1.79 cm. Additional Comments: Technically difficult echo with poor image quality.  LEFT VENTRICLE PLAX 2D LVOT diam:     1.80 cm LV SV:         27 LV SV Index:   13 LVOT Area:     2.54 cm  AORTIC VALVE AV Area (Vmax):    1.80 cm AV Area  (Vmean):   1.74 cm AV Area (VTI):     1.79 cm AV Vmax:           104.50 cm/s AV Vmean:          71.100 cm/s AV VTI:            0.151 m AV Peak Grad:      4.4 mmHg AV Mean Grad:      2.5 mmHg LVOT Vmax:  73.80 cm/s LVOT Vmean:        48.500 cm/s LVOT VTI:          0.106 m LVOT/AV VTI ratio: 0.70  AORTA Ao Root diam: 3.40 cm  SHUNTS Systemic VTI:  0.11 m Systemic Diam: 1.80 cm Kristeen MissPhilip Nahser MD Electronically signed by Kristeen MissPhilip Nahser MD Signature Date/Time: 12/30/2019/2:43:44 PM    Final

## 2020-01-01 NOTE — Progress Notes (Signed)
Physical Therapy Treatment Patient Details Name: Theodore Kim MRN: [id number] DOB: June 17, 1956 Today's Date: 01/01/2020    History of Present Illness 64 year old male admitted 12/21/19, transferred from [facility], to ICU for worsening hypoxemia. He was treated with IV Actemra, Remdesivir, and steroirds. Noted patient with worsening polycythemia. Patient was diagnosed with COVID on 12/19/19. PMH: DM2    PT Comments    Patient reports feeling stronger and more steady on his feet. Patient on room air at start of session with oxygen saturation 90% at rest in bed. Patient on room air upon PT arrival.  At rest in bed, oxygen saturation 90% on room air.  Sitting EOB, oxygen saturation 84% on room air.  Sitting EOB on 3L [place]: 93%  Negotiating single step: down to 77% (not accurate pleth) or 82% (with accurate pleth) on 3L requiring seated rest and 4L suppl oxygen.  Ambulation in room on 3L: desatted to low 80s% (?), recovered to 88% with seated rest approx 30 seconds. Back on room air at end of session at rest in bed with oxygen saturation 89-90%.   Patient declined practicing O2 tube management noting his wife is going to be there to supervise/assist him for the first few weeks. Patient reports he has assistance into the home. Recommend w/c van/ambulance and/or assistance and increasing supplemental oxygen for negotiating into the home with seated rest breaks pre and post stairs.      Follow Up Recommendations  Home health PT;Supervision/Assistance - 24 hour     Equipment Recommendations  3in1 (PT);Other (comment)(4WW/rollator)       Precautions / Restrictions Precautions Precautions: Fall;Other (comment) Precaution Comments: monitor oxygen saturation Restrictions Weight Bearing Restrictions: No    Mobility  Bed Mobility Overal bed mobility: Modified Independent Bed Mobility: Supine to Sit     Supine to sit: Modified independent (Device/Increase time) Sit to supine: Modified  independent (Device/Increase time)      Transfers Overall transfer level: Needs assistance Equipment used: 4-wheeled walker;None Transfers: Sit to/from Stand Sit to Stand: Modified independent (Device/Increase time);Supervision         General transfer comment: sit<>stand without AD with unilat UE support on bedrail with supervision, sit<>stand with 8EX modI   Ambulation/Gait Ambulation/Gait assistance: Supervision;Modified independent (Device/Increase time) Gait Distance (Feet): 20 Feet Assistive device: 4-wheeled walker Gait Pattern/deviations: Step-through pattern;Trunk flexed Gait velocity: decreased   General Gait Details: Patient on 3L [place] during ambulation with 4WW. PT managing oxygen tubing and tank.   Stairs Stairs: Yes Stairs assistance: Min assist Stair Management: One rail Left;Step to pattern Number of Stairs: 1 General stair comments: Patient on 3L for step negotiation and desatted to high 70s/low80s after negotiating one step requiring seated rest break on EOB and 4L suppl oxygen.      Balance Overall balance assessment: Needs assistance Sitting-balance support: Feet supported;Bilateral upper extremity supported Sitting balance-Leahy Scale: Good     Standing balance support: Bilateral upper extremity supported;Single extremity supported Standing balance-Leahy Scale: (Fair+) Standing balance comment: Static standing with unilat UE support on bedrail and supervision    Cognition Arousal/Alertness: Awake/alert Behavior During Therapy: WFL for tasks assessed/performed Overall Cognitive Status: Within Functional Limits for tasks assessed         General Comments General comments (skin integrity, edema, etc.): Patient on room air upon PT arrival. At rest in bed, oxygen saturation 90%. Sitting EOB on room air, oxygen saturation 84%. Patient put on 3L  sitting EOB and oxygen saturation 93% at rest. Down to 77% (not  accurate pleth) or 82% (with accurate  pleth) on 3L for negotiating single step in room with UE support on railing and minA from PT. Patient required seated rest and 4L suppl oxygen. Sitting EOB while discussing discharge recommendations, plan, and rehab process, oxygen saturation to 86% on 3L [place]. Ambulation in room on 3L and oxygen saturation down to low 80s% but not good pleth, recovered to 88% with seated rest approx 30 seconds. Back on room air at end of session at rest in bed with oxygen saturation 89-90%.       Pertinent Vitals/Pain Pain Assessment: No/denies pain(No complaints of pain. No signs/symptoms of pain.)           PT Goals (current goals can now be found in the care plan section) Progress towards PT goals: Progressing toward goals    Frequency    Min 3X/week      PT Plan Current plan remains appropriate       AM-PAC PT "6 Clicks" Mobility   Outcome Measure  Help needed turning from your back to your side while in a flat bed without using bedrails?: None Help needed moving from lying on your back to sitting on the side of a flat bed without using bedrails?: None Help needed moving to and from a bed to a chair (including a wheelchair)?: A Little Help needed standing up from a chair using your arms (e.g., wheelchair or bedside chair)?: None Help needed to walk in hospital room?: A Little Help needed climbing 3-5 steps with a railing? : A Little 6 Click Score: 21    End of Session Equipment Utilized During Treatment: Oxygen Activity Tolerance: Patient limited by fatigue Patient left: in bed;with call bell/phone within reach Nurse Communication: Other (comment)(patient's report of IV discomfort) PT Visit Diagnosis: Unsteadiness on feet (R26.81);Other abnormalities of gait and mobility (R26.89);Muscle weakness (generalized) (M62.81)     Time: 1700-1749 PT Time Calculation (min) (ACUTE ONLY): 42 min  Charges:  $Gait Training: 38-52 mins                     Birdie Hopes, [place], DPT Acute  Rehab [phone] office     Birdie Hopes 01/01/2020, 3:47 PM

## 2020-01-01 NOTE — Discharge Instructions (Signed)
Follow with Primary MD in 7 days   Get CBC, CMP, 2 view Chest X ray -  checked next visit within 1 week by Primary MD    Activity: With 24 x 7 assistance and supervision for 2 weeks, use walker as needed.  Disposition Home   Diet: Heart Healthy  Low Carb  Special Instructions: If you have smoked or chewed Tobacco  in the last 2 yrs please stop smoking, stop any regular Alcohol  and or any Recreational drug use.  On your next visit with your primary care physician please Get Medicines reviewed and adjusted.  Please request your Prim.MD to go over all Hospital Tests and Procedure/Radiological results at the follow up, please get all Hospital records sent to your Prim MD by signing hospital release before you go home.  If you experience worsening of your admission symptoms, develop shortness of breath, life threatening emergency, suicidal or homicidal thoughts you must seek medical attention immediately by calling 911 or calling your MD immediately  if symptoms less severe.  You Must read complete instructions/literature along with all the possible adverse reactions/side effects for all the Medicines you take and that have been prescribed to you. Take any new Medicines after you have completely understood and accpet all the possible adverse reactions/side effects.       Person Under Monitoring Name: Theodore Kim  Location: [address] [place] [address]   Infection Prevention Recommendations for Individuals Confirmed to have, or Being Evaluated for, 2019 Novel Coronavirus (COVID-19) Infection Who Receive Care at Home  Individuals who are confirmed to have, or are being evaluated for, COVID-19 should follow the prevention steps below until a healthcare provider or local or state health department says they can return to normal activities.  Stay home except to get medical care You should restrict activities outside your home, except for getting medical care. Do not go to  work, school, or public areas, and do not use public transportation or taxis.  Call ahead before visiting your doctor Before your medical appointment, call the healthcare provider and tell them that you have, or are being evaluated for, COVID-19 infection. This will help the healthcare provider's office take steps to keep other people from getting infected. Ask your healthcare provider to call the local or state health department.  Monitor your symptoms Seek prompt medical attention if your illness is worsening (e.g., difficulty breathing). Before going to your medical appointment, call the healthcare provider and tell them that you have, or are being evaluated for, COVID-19 infection. Ask your healthcare provider to call the local or state health department.  Wear a facemask You should wear a facemask that covers your nose and mouth when you are in the same room with other people and when you visit a healthcare provider. People who live with or visit you should also wear a facemask while they are in the same room with you.  Separate yourself from other people in your home As much as possible, you should stay in a different room from other people in your home. Also, you should use a separate bathroom, if available.  Avoid sharing household items You should not share dishes, drinking glasses, cups, eating utensils, towels, bedding, or other items with other people in your home. After using these items, you should wash them thoroughly with soap and water.  Cover your coughs and sneezes Cover your mouth and nose with a tissue when you cough or sneeze, or you can cough or sneeze  into your sleeve. Throw used tissues in a lined trash can, and immediately wash your hands with soap and water for at least 20 seconds or use an alcohol-based hand rub.  Wash your [employer] your hands often and thoroughly with soap and water for at least 20 seconds. You can use an alcohol-based hand sanitizer if  soap and water are not available and if your hands are not visibly dirty. Avoid touching your eyes, nose, and mouth with unwashed hands.   Prevention Steps for Caregivers and Household Members of Individuals Confirmed to have, or Being Evaluated for, COVID-19 Infection Being Cared for in the Home  If you live with, or provide care at home for, a person confirmed to have, or being evaluated for, COVID-19 infection please follow these guidelines to prevent infection:  Follow healthcare provider's instructions Make sure that you understand and can help the patient follow any healthcare provider instructions for all care.  Provide for the patient's basic needs You should help the patient with basic needs in the home and provide support for getting groceries, prescriptions, and other personal needs.  Monitor the patient's symptoms If they are getting sicker, call his or her medical provider and tell them that the patient has, or is being evaluated for, COVID-19 infection. This will help the healthcare provider's office take steps to keep other people from getting infected. Ask the healthcare provider to call the local or state health department.  Limit the number of people who have contact with the patient  If possible, have only one caregiver for the patient.  Other household members should stay in another home or place of residence. If this is not possible, they should stay  in another room, or be separated from the patient as much as possible. Use a separate bathroom, if available.  Restrict visitors who do not have an essential need to be in the home.  Keep older adults, very young children, and other sick people away from the patient Keep older adults, very young children, and those who have compromised immune systems or chronic health conditions away from the patient. This includes people with chronic heart, lung, or kidney conditions, diabetes, and cancer.  Ensure good  ventilation Make sure that shared spaces in the home have good air flow, such as from an air conditioner or an opened window, weather permitting.  Wash your hands often  Wash your hands often and thoroughly with soap and water for at least 20 seconds. You can use an alcohol based hand sanitizer if soap and water are not available and if your hands are not visibly dirty.  Avoid touching your eyes, nose, and mouth with unwashed hands.  Use disposable paper towels to dry your hands. If not available, use dedicated cloth towels and replace them when they become wet.  Wear a facemask and gloves  Wear a disposable facemask at all times in the room and gloves when you touch or have contact with the patient's blood, body fluids, and/or secretions or excretions, such as sweat, saliva, sputum, nasal mucus, vomit, urine, or feces.  Ensure the mask fits over your nose and mouth tightly, and do not touch it during use.  Throw out disposable facemasks and gloves after using them. Do not reuse.  Wash your hands immediately after removing your facemask and gloves.  If your personal clothing becomes contaminated, carefully remove clothing and launder. Wash your hands after handling contaminated clothing.  Place all used disposable facemasks, gloves, and other waste in  a lined container before disposing them with other household waste.  Remove gloves and wash your hands immediately after handling these items.  Do not share dishes, glasses, or other household items with the patient  Avoid sharing household items. You should not share dishes, drinking glasses, cups, eating utensils, towels, bedding, or other items with a patient who is confirmed to have, or being evaluated for, COVID-19 infection.  After the person uses these items, you should wash them thoroughly with soap and water.  Wash laundry thoroughly  Immediately remove and wash clothes or bedding that have blood, body fluids, and/or  secretions or excretions, such as sweat, saliva, sputum, nasal mucus, vomit, urine, or feces, on them.  Wear gloves when handling laundry from the patient.  Read and follow directions on labels of laundry or clothing items and detergent. In general, wash and dry with the warmest temperatures recommended on the label.  Clean all areas the individual has used often  Clean all touchable surfaces, such as counters, tabletops, doorknobs, bathroom fixtures, toilets, phones, keyboards, tablets, and bedside tables, every day. Also, clean any surfaces that may have blood, body fluids, and/or secretions or excretions on them.  Wear gloves when cleaning surfaces the patient has come in contact with.  Use a diluted bleach solution (e.g., dilute bleach with 1 part bleach and 10 parts water) or a household disinfectant with a label that says EPA-registered for coronaviruses. To make a bleach solution at home, add 1 tablespoon of bleach to 1 quart (4 cups) of water. For a larger supply, add  cup of bleach to 1 gallon (16 cups) of water.  Read labels of cleaning products and follow recommendations provided on product labels. Labels contain instructions for safe and effective use of the cleaning product including precautions you should take when applying the product, such as wearing gloves or eye protection and making sure you have good ventilation during use of the product.  Remove gloves and wash hands immediately after cleaning.  Monitor yourself for signs and symptoms of illness Caregivers and household members are considered close contacts, should monitor their health, and will be asked to limit movement outside of the home to the extent possible. Follow the monitoring steps for close contacts listed on the symptom monitoring form.   ? If you have additional questions, contact your local health department or call the epidemiologist on call at [phone] (available 24/7). ? This guidance is subject  to change. For the most up-to-date guidance from [facility], please refer to their website: [email or web address]

## 2020-01-02 DIAGNOSIS — R042 Hemoptysis: Secondary | ICD-10-CM

## 2020-01-02 LAB — CBC WITH DIFFERENTIAL/PLATELET
Abs Immature Granulocytes: 0.06 10*3/uL (ref 0.00–0.07)
Basophils Absolute: 0.1 10*3/uL (ref 0.0–0.1)
Basophils Relative: 1 %
Eosinophils Absolute: 0.1 10*3/uL (ref 0.0–0.5)
Eosinophils Relative: 1 %
HCT: 48.6 % (ref 39.0–52.0)
Hemoglobin: 17 g/dL (ref 13.0–17.0)
Immature Granulocytes: 1 %
Lymphocytes Relative: 17 %
Lymphs Abs: 2.2 10*3/uL (ref 0.7–4.0)
MCH: 30.9 pg (ref 26.0–34.0)
MCHC: 35 g/dL (ref 30.0–36.0)
MCV: 88.4 fL (ref 80.0–100.0)
Monocytes Absolute: 1 10*3/uL (ref 0.1–1.0)
Monocytes Relative: 8 %
Neutro Abs: 9.5 10*3/uL — ABNORMAL HIGH (ref 1.7–7.7)
Neutrophils Relative %: 72 %
Platelets: 272 10*3/uL (ref 150–400)
RBC: 5.5 MIL/uL (ref 4.22–5.81)
RDW: 13 % (ref 11.5–15.5)
WBC: 13 10*3/uL — ABNORMAL HIGH (ref 4.0–10.5)
nRBC: 0 % (ref 0.0–0.2)

## 2020-01-02 LAB — BASIC METABOLIC PANEL
Anion gap: 9 (ref 5–15)
BUN: 20 mg/dL (ref 8–23)
CO2: 22 mmol/L (ref 22–32)
Calcium: 8.1 mg/dL — ABNORMAL LOW (ref 8.9–10.3)
Chloride: 95 mmol/L — ABNORMAL LOW (ref 98–111)
Creatinine, Ser: 1.13 mg/dL (ref 0.61–1.24)
GFR calc Af Amer: >60 mL/min (ref >60)
GFR calc non Af Amer: >60 mL/min (ref >60)
Glucose, Bld: 106 mg/dL — ABNORMAL HIGH (ref 70–99)
Potassium: 3.6 mmol/L (ref 3.5–5.1)
Sodium: 126 mmol/L — ABNORMAL LOW (ref 135–145)

## 2020-01-02 LAB — OSMOLALITY, URINE: Osmolality, Ur: 591 mOsm/kg (ref 300–900)

## 2020-01-02 LAB — SODIUM, URINE, RANDOM: Sodium, Ur: <10 mmol/L

## 2020-01-02 LAB — GLUCOSE, CAPILLARY
Glucose-Capillary: 128 mg/dL — ABNORMAL HIGH (ref 70–99)
Glucose-Capillary: 164 mg/dL — ABNORMAL HIGH (ref 70–99)
Glucose-Capillary: 157 mg/dL — ABNORMAL HIGH (ref 70–99)
Glucose-Capillary: 179 mg/dL — ABNORMAL HIGH (ref 70–99)
Glucose-Capillary: 109 mg/dL — ABNORMAL HIGH (ref 70–99)

## 2020-01-02 LAB — OSMOLALITY: Osmolality: 284 mOsm/kg (ref 275–295)

## 2020-01-02 LAB — URIC ACID: Uric Acid, Serum: 11 mg/dL — ABNORMAL HIGH (ref 3.7–8.6)

## 2020-01-02 LAB — CREATININE, URINE, RANDOM: Creatinine, Urine: 171.36 mg/dL

## 2020-01-02 MED ORDER — SODIUM CHLORIDE 0.9 % IV SOLN: INTRAVENOUS | Status: DC

## 2020-01-02 NOTE — Progress Notes (Signed)
Occupational Therapy Treatment Patient Details Name: Theodore Kim MRN: [id number] DOB: 03-Dec-1955 Today's Date: 01/02/2020    History of present illness 64 year old male admitted 12/21/19, transferred from [facility], to ICU for worsening hypoxemia. He was treated with IV Actemra, Remdesivir, and steroirds. Noted patient with worsening polycythemia. Patient was diagnosed with COVID on 12/19/19. PMH: DM2   OT comments  Pt progressing with OT goals and agreeable to participate in full ADL session this AM. Pt received on RA 94% at rest in bed, desats to 86-88% sitting EOB on RA. Increased to 3 L O2 for activity given pts decreased activity tolerance and hx of quick desats. Pt Supervision at most for transfers and mobility during session without use of Rollator short distances and with Rollator across room. Guided pt in various oral care tasks, UB bathing/dressing, and LB bathing/dressing sitting and intermittent standing at sink. Pt required no more than Setup/Supervision for these tasks, but due to decreased activity tolerance, multiple rest breaks required. During ADLs, difficulty obtaining good O2 reading (see below), but good pleth noted at 82-85% on 3-4 L O2 with pt experiencing SOB. After implementation of energy conservation techniques and mobility to recliner chair with Rollator, pt stats at 88% on 3-4 L O2 with slow reducing of O2. Within 4 minutes, pt recovered and stats at 92-94% on RA. Reinforced energy conservations strategy use at home with pt verbalizing agreement. HHOT continues to remain appropriate. Will continue to follow acutely.   Follow Up Recommendations  Home health OT;Supervision/Assistance - 24 hour    Equipment Recommendations  3 in 1 bedside commode;Other (comment)(4WW)    Recommendations for Other Services      Precautions / Restrictions Precautions Precautions: Fall;Other (comment) Precaution Comments: monitor oxygen saturation Restrictions Weight Bearing  Restrictions: No       Mobility Bed Mobility Overal bed mobility: Modified Independent Bed Mobility: Supine to Sit     Supine to sit: Modified independent (Device/Increase time)        Transfers Overall transfer level: Needs assistance Equipment used: 4-wheeled walker;None Transfers: Sit to/from [facility] Sit to Stand: Modified independent (Device/Increase time);Supervision Stand pivot transfers: Supervision            Balance Overall balance assessment: Needs assistance Sitting-balance support: Feet supported;Bilateral upper extremity supported Sitting balance-Leahy Scale: Good     Standing balance support: Bilateral upper extremity supported;Single extremity supported Standing balance-Leahy Scale: Fair                             ADL either performed or assessed with clinical judgement   ADL Overall ADL's : Needs assistance/impaired     Grooming: Set up;Wash/dry hands;Wash/dry face;Oral care;Brushing hair;Applying deodorant;Sitting Grooming Details (indicate cue type and reason): Grooming tasks completed sitting at sink Upper Body Bathing: Set up;Sitting   Lower Body Bathing: Set up;Supervison/ safety;Sit to/from stand Lower Body Bathing Details (indicate cue type and reason): Sitting and standing at sink  Upper Body Dressing : Set up;Sitting   Lower Body Dressing: Set up;Supervision/safety;Sit to/from stand;Sitting/lateral leans Lower Body Dressing Details (indicate cue type and reason): Supervision/setup to don socks and underwear             Functional mobility during ADLs: Supervision/safety(Rollator) General ADL Comments: Pt overall capable of completing ADL routine this AM, required cues for energy conservation technique implementation due to increasing fatigue and desats at times      Vision  Perception     Praxis      Cognition Arousal/Alertness: Awake/alert Behavior During Therapy: WFL for tasks  assessed/performed Overall Cognitive Status: Within Functional Limits for tasks assessed                                          Exercises Exercises: Other exercises Other Exercises Other Exercises: pursed lip breathing   Shoulder Instructions       General Comments Pt received on RA at 94% at rest in bed. 86-88% sitting EOB on RA. Increased to 3-4 L O2 with difficulty maintaining good readings. 77% did not show good pleth and did not match pt's appearance. 82-85% noted with good pleth during activity with matching SOB. 86-88% during mobilty to chair with Rollator on 3-4 L O2. Slowly reduced O2 with pt statting at 94% on RA within 4 minutes.      Pertinent Vitals/ Pain       Pain Assessment: No/denies pain  Home Living                                          Prior Functioning/Environment              Frequency  Min 3X/week        Progress Toward Goals  OT Goals(current goals can now be found in the care plan section)  Progress towards OT goals: Progressing toward goals  Acute Rehab OT Goals Patient Stated Goal: go home OT Goal Formulation: With patient Time For Goal Achievement: 01/08/20 Potential to Achieve Goals: Good ADL Goals Pt Will Perform Grooming: with modified independence;standing Pt Will Perform Upper Body Bathing: with modified independence;sitting Pt Will Perform Lower Body Bathing: with modified independence;sit to/from stand Pt Will Perform Lower Body Dressing: with modified independence;sitting/lateral leans;sit to/from stand Pt Will Transfer to Toilet: with modified independence;ambulating;regular height toilet Pt Will Perform Toileting - Clothing Manipulation and hygiene: with modified independence;sitting/lateral leans;sit to/from stand Additional ADL Goal #1: Pt will verbalize at least 3 energy conservation strategies to implement during ADLs in order to maximize independence.  Plan Discharge plan remains  appropriate    Co-evaluation          OT goals addressed during session: ADL's and self-care;Proper use of [employer] equipment and DME      AM-PAC OT "6 Clicks" Daily Activity     Outcome Measure   Help from another person eating meals?: None Help from another person taking care of personal grooming?: None Help from another person toileting, which includes using toliet, bedpan, or urinal?: A Little Help from another person bathing (including washing, rinsing, drying)?: A Little Help from another person to put on and taking off regular upper body clothing?: A Little Help from another person to put on and taking off regular lower body clothing?: A Little 6 Click Score: 20    End of Session Equipment Utilized During Treatment: Oxygen;Other (comment)(Rollator)  OT Visit Diagnosis: Unsteadiness on feet (R26.81);Other abnormalities of gait and mobility (R26.89);Muscle weakness (generalized) (M62.81)   Activity Tolerance Patient tolerated treatment well   Patient Left in chair;with call bell/phone within reach   Nurse Communication Mobility status;Other (comment)(O2)        Time: 0930-1040 OT Time Calculation (min): 70 min  Charges: OT General Charges $OT Visit: 1 Visit  OT Treatments $Self Care/Home Management : 38-52 mins $Therapeutic Activity: 23-37 mins  Lorre Munroe, OTR/L   Lorre Munroe 01/02/2020, 1:30 PM

## 2020-01-02 NOTE — Progress Notes (Signed)
PROGRESS NOTE                                                                                                                                                                                                             Patient Demographics:    Theodore Kim, is a 64 y.o. male, DOB - 1956/07/02, WUJ:[id number]  Outpatient Primary MD for the patient is No primary care provider on file.   Admit date - 12/21/2019   LOS - 12  No chief complaint on file.      Brief Narrative: Patient is a 64 y.o. male with PMHx of DM-2, HTN who was diagnosed with COVID-19 on 3/17-transferred from [facility] to [facility] ICU for worsening hypoxemia.  He was managed in the ICU-and upon stability transfer to the [facility].  See below for further details.  Significant Events: 3/19>> admit to [facility], ICU for worsening hypoxemia-initially requiring 15 L of HFNC 3/20>> TTE EF 55-60% 3/22>> transferred to [facility]  COVID-19 medications: Remdesivir: 3/19>>3/23 Steroids: 3/19>> Actemra: 3/19 x 1  Microbiology data: 3/19: Blood culture: Negative  DVT prophylaxis: Prophylactic Lovenox  Procedures: None  Consults: PCCM   Subjective:   Patient in bed, appears comfortable, denies any headache, no fever, no chest pain or pressure, no shortness of breath , no abdominal pain. No focal weakness.   Assessment  & Plan :   Acute Hypoxic Resp Failure due to Covid 19 Viral pneumonia: He had severe disease with extreme hypoxia requiring 15 L high flow oxygen, he was treated appropriately with IV Actemra, remdesivir and steroids, oxygen requirements down to 2 L.  Encouraged to sit up, advance activity and titrate down oxygen.  Also encouraged to use I-S and flutter valve for pulmonary toiletry.  Continue to monitor closely.  Clinically improving.      Recent Labs  Lab 12/29/19 0307 12/30/19 0243 12/31/19 0502 01/01/20 0300  01/02/20 0413  WBC 13.5* 16.6* 15.9* 14.6* 13.0*  HGB 19.1* 20.3* 19.2* 17.9* 17.0  HCT 54.6* 57.5* 54.4* 51.3 48.6  PLT 501* 625* 482* 383 272  MCV 88.9 86.7 86.5 88.0 88.4  MCH 31.1 30.6 30.5 30.7 30.9  MCHC 35.0 35.3 35.3 34.9 35.0  RDW 13.2 13.0 12.9 12.9 13.0  LYMPHSABS  --  1.3 2.2 2.7 2.2  MONOABS  --  0.2 1.1* 1.1* 1.0  EOSABS  --  0.0 0.1 0.2 0.1  BASOSABS  --  0.1 0.1 0.1 0.1    Recent Labs  Lab 12/27/19 0404 12/27/19 0404 12/28/19 0514 12/28/19 0514 12/29/19 0307 12/30/19 0243 12/30/19 0415 12/31/19 0502 01/01/20 0300 01/02/20 0413  NA 134*   < > 133*   < > 131* 129*  --  129* 133* 126*  K 5.0   < > 5.1   < > 4.4 5.2*  --  3.2* 3.7 3.6  CL 98   < > 97*   < > 99 94*  --  93* 95* 95*  CO2 21*   < > 21*   < > 18* 20*  --  20* 26 22  GLUCOSE 183*   < > 233*   < > 155* 203*  --  112* 97 106*  BUN 45*   < > 47*   < > 38* 46*  --  40* 29* 20  CREATININE 1.21   < > 1.24   < > 1.12 1.43*  --  1.15 1.22 1.13  CALCIUM 9.0   < > 9.4   < > 9.0 9.2  --  8.6* 8.5* 8.1*  AST 27  --  31  --  37 37  --  43*  --   --   ALT 36  --  41  --  43 60*  --  60*  --   --   ALKPHOS 157*  --  164*  --  146* 163*  --  143*  --   --   BILITOT 1.5*  --  1.6*  --  2.0* 2.5*  --  2.6*  --   --   ALBUMIN 2.8*  --  3.0*  --  3.0* 3.2*  --  2.9*  --   --   MG  --   --   --   --  2.2 2.4  --  2.3  --   --   CRP 0.7  --  0.6  --  0.6 0.6  --  <0.5  --   --   DDIMER 1.74*  --   --   --  0.89*  --  1.05* 0.65*  --   --   PROCALCITON  --   --   --   --  <0.10 <0.10  --  0.21  --   --   BNP  --   --   --   --  175.2* 179.8*  --  33.5  --   --    < > = values in this interval not displayed.    Recent Labs  Lab 12/27/19 0404 12/28/19 0514 12/29/19 0307 12/30/19 0243 12/30/19 0415 12/31/19 0502  CRP 0.7 0.6 0.6 0.6  --  <0.5  DDIMER 1.74*  --  0.89*  --  1.05* 0.65*  BNP  --   --  175.2* 179.8*  --  33.5  PROCALCITON  --   --  <0.10 <0.10  --  0.21        Prone/Incentive Spirometry:  encouraged incentive spirometry/flutter use 3-4/hour.  Hemoptysis: 1 episode on 3/25-appears to be mild-none since then.  Stopped aspirin-changed Lovenox back to daily dosing.  Chest x-ray without any major findings on 3/25.  Stable CT angiogram chest..  Worsening polycythemia:   Note upon admission his hemoglobin was around 14.8, erythropoietin level was 6.9, case was discussed with Dr. Myna Hidalgo couple of times, his acute worsening could have been due to  dehydration and stress reaction, with IV fluids hemoglobin levels trending down, post discharge will follow with PCP and if needed with Dr. Myna Hidalgo.  HTN: BP controlled-hold amlodipine-as BP stable-in therapeutic phlebotomy will be performed.  Possible aortic valve density noted on echocardiogram done in the ICU on 12/24/2019. Clinically no signs of bacterial infection or bacteremia, repeat echocardiogram limited done on 12/30/2019 shows normal aortic valve.  Negative blood cultures and stable procalcitonin.  Hyponatremia.  Persistent and worsening despite hydration, initially thought to be due to dehydration however consistently dropping despite IV fluids, check serum osmolality, uric acid, urine osmolality and sodium.  Rule out SIADH.  DM-2 (A1c 8.3 on 12/21/2019) poor outpatient control due to hyperglycemia  : Lantus and sliding scale.  Monitor and adjust.  CBG (last 3)  Recent Labs    01/01/20 1551 01/01/20 2105 01/02/20 0721  GLUCAP 80 97 128*     Condition -  Fair  Family Communication  :  Spouse Lupita Leash [phone]  updated over the phone 12/29/19, 12/30/19, 12/31/19, 01/01/20, called 01/02/2020 at 10:13 AM, over 15 rings no response.  Code Status :  Full Code  Diet :  Diet Order            Diet heart healthy/carb modified Room service appropriate? Yes; Fluid consistency: Thin  Diet effective now               Disposition Plan  : Has developed a moderate hyponatremia, work-up underway.  Has finished treatment for COVID-19  pneumonia.  Once sodium level stabilizes will be discharged to home with [facility] PT.  Antimicorbials  :    Anti-infectives (From admission, onward)   Start     Dose/Rate Route Frequency Ordered Stop   12/23/19 1000  remdesivir 100 mg in sodium chloride 0.9 % 100 mL IVPB  Status:  Discontinued     100 mg 200 mL/hr over 30 Minutes Intravenous Daily 12/21/19 2231 12/21/19 2246   12/22/19 1000  remdesivir 100 mg in sodium chloride 0.9 % 100 mL IVPB     100 mg 200 mL/hr over 30 Minutes Intravenous Daily 12/21/19 2248 12/25/19 0937   12/22/19 0900  remdesivir 200 mg in sodium chloride 0.9% 250 mL IVPB  Status:  Discontinued     200 mg 580 mL/hr over 30 Minutes Intravenous Once 12/21/19 2231 12/21/19 2246      Inpatient Medications  Scheduled Meds: . enoxaparin (LOVENOX) injection  40 mg Subcutaneous Q24H  . insulin aspart  0-20 Units Subcutaneous TID WC  . insulin aspart  0-5 Units Subcutaneous QHS  . insulin glargine  14 Units Subcutaneous Daily  . mouth rinse  15 mL Mouth Rinse BID  . sodium chloride  2 spray Each Nare BID   Continuous Infusions:  PRN Meds:.acetaminophen, albuterol, alum & mag hydroxide-simeth, benzonatate, chlorpheniramine-HYDROcodone   Time Spent in minutes  25  See all Orders from today for further details   Susa Raring M.D on 01/02/2020 at 10:11 AM  To page go to [email or web address] - use universal password  [facility] -  Office  [phone]    Objective:   Vitals:   01/02/20 0023 01/02/20 0045 01/02/20 0522 01/02/20 0656  BP:   106/76   Pulse: 88 97 84   Resp:   20   Temp:   97.6 F (36.4 C)   TempSrc:   Oral   SpO2: 90% (!) 85% 98%   Weight:    82.8 kg  Height:  Wt Readings from Last 3 Encounters:  01/02/20 82.8 kg     Intake/Output Summary (Last 24 hours) at 01/02/2020 1011 Last data filed at 01/02/2020 0526 Gross per 24 hour  Intake 915.18 ml  Output 975 ml  Net -59.82 ml     Physical Exam  Awake Alert, No new F.N  deficits, Normal affect [place].AT,PERRAL Supple Neck,No JVD, No cervical lymphadenopathy appriciated.  Symmetrical Chest wall movement, Good air movement bilaterally, CTAB RRR,No Gallops, Rubs or new Murmurs, No Parasternal Heave +ve B.Sounds, Abd Soft, No tenderness, No organomegaly appriciated, No rebound - guarding or rigidity. No Cyanosis, Clubbing or edema, No new Rash or bruise     Data Review:    CBC Recent Labs  Lab 12/29/19 0307 12/30/19 0243 12/31/19 0502 01/01/20 0300 01/02/20 0413  WBC 13.5* 16.6* 15.9* 14.6* 13.0*  HGB 19.1* 20.3* 19.2* 17.9* 17.0  HCT 54.6* 57.5* 54.4* 51.3 48.6  PLT 501* 625* 482* 383 272  MCV 88.9 86.7 86.5 88.0 88.4  MCH 31.1 30.6 30.5 30.7 30.9  MCHC 35.0 35.3 35.3 34.9 35.0  RDW 13.2 13.0 12.9 12.9 13.0  LYMPHSABS  --  1.3 2.2 2.7 2.2  MONOABS  --  0.2 1.1* 1.1* 1.0  EOSABS  --  0.0 0.1 0.2 0.1  BASOSABS  --  0.1 0.1 0.1 0.1    Chemistries  Recent Labs  Lab 12/27/19 0404 12/27/19 0404 12/28/19 0514 12/28/19 0514 12/29/19 0307 12/30/19 0243 12/31/19 0502 01/01/20 0300 01/02/20 0413  NA 134*   < > 133*   < > 131* 129* 129* 133* 126*  K 5.0   < > 5.1   < > 4.4 5.2* 3.2* 3.7 3.6  CL 98   < > 97*   < > 99 94* 93* 95* 95*  CO2 21*   < > 21*   < > 18* 20* 20* 26 22  GLUCOSE 183*   < > 233*   < > 155* 203* 112* 97 106*  BUN 45*   < > 47*   < > 38* 46* 40* 29* 20  CREATININE 1.21   < > 1.24   < > 1.12 1.43* 1.15 1.22 1.13  CALCIUM 9.0   < > 9.4   < > 9.0 9.2 8.6* 8.5* 8.1*  MG  --   --   --   --  2.2 2.4 2.3  --   --   AST 27  --  31  --  37 37 43*  --   --   ALT 36  --  41  --  43 60* 60*  --   --   ALKPHOS 157*  --  164*  --  146* 163* 143*  --   --   BILITOT 1.5*  --  1.6*  --  2.0* 2.5* 2.6*  --   --    < > = values in this interval not displayed.   ------------------------------------------------------------------------------------------------------------------ No results for input(s): CHOL, HDL, LDLCALC, TRIG, CHOLHDL,  LDLDIRECT in the last 72 hours.  Lab Results  Component Value Date   HGBA1C 8.3 (H) 12/21/2019   ------------------------------------------------------------------------------------------------------------------ No results for input(s): TSH, T4TOTAL, T3FREE, THYROIDAB in the last 72 hours.  Invalid input(s): FREET3 ------------------------------------------------------------------------------------------------------------------ No results for input(s): VITAMINB12, FOLATE, FERRITIN, TIBC, IRON, RETICCTPCT in the last 72 hours.  Coagulation profile No results for input(s): INR, PROTIME in the last 168 hours.  Recent Labs    12/31/19 0502  DDIMER 0.65*    Cardiac Enzymes No  results for input(s): CKMB, TROPONINI, MYOGLOBIN in the last 168 hours.  Invalid input(s): CK ------------------------------------------------------------------------------------------------------------------    Component Value Date/Time   BNP 33.5 12/31/2019 0502    Micro Results Recent Results (from the past 240 hour(s))  MRSA PCR Screening     Status: None   Collection Time: 12/25/19  4:26 AM   Specimen: Nasal Mucosa; Nasopharyngeal  Result Value Ref Range Status   MRSA by PCR NEGATIVE NEGATIVE Final    Comment:        The GeneXpert MRSA Assay (FDA approved for NASAL specimens only), is one component of a comprehensive MRSA colonization surveillance program. It is not intended to diagnose MRSA infection nor to guide or monitor treatment for MRSA infections. Performed at [facility], [address] [place], [place] [address]   Culture, blood (routine x 2)     Status: None (Preliminary result)   Collection Time: 12/29/19 12:52 PM   Specimen: BLOOD  Result Value Ref Range Status   Specimen Description BLOOD RIGHT ANTECUBITAL  Final   Special Requests   Final    AEROBIC BOTTLE ONLY Blood Culture results may not be optimal due to an inadequate volume of blood received in culture bottles    Culture   Final    NO GROWTH 3 DAYS Performed at [facility], [address], [place], [place] [address]    Report Status PENDING  Incomplete  Culture, blood (routine x 2)     Status: None (Preliminary result)   Collection Time: 12/29/19  1:00 PM   Specimen: BLOOD RIGHT HAND  Result Value Ref Range Status   Specimen Description BLOOD RIGHT HAND  Final   Special Requests   Final    BOTTLES DRAWN AEROBIC AND ANAEROBIC Blood Culture adequate volume   Culture   Final    NO GROWTH 3 DAYS Performed at [facility], [address], [place], [place] [address]    Report Status PENDING  Incomplete    Radiology Reports CT CHEST W CONTRAST  Result Date: 12/29/2019 CLINICAL DATA:  Hemoptysis. COVID EXAM: CT CHEST WITH CONTRAST TECHNIQUE: Multidetector CT imaging of the chest was performed during intravenous contrast administration. CONTRAST:  32mL OMNIPAQUE IOHEXOL 300 MG/ML  SOLN COMPARISON:  None. FINDINGS: Cardiovascular: Heart size normal. No pericardial effusion. Coronary calcifications. Aortic Atherosclerosis (ICD10-170.0). Mediastinum/Nodes: Enlarged AP window and right paratracheal nodes up to 1.5 cm short axis diameter. No definite hilar adenopathy. Lungs/Pleura: No pleural effusion. No pneumothorax. Bronchiectasis posteriorly in both lower lobes. Coarse subpleural interstitial thickening in both lungs. Patchy ground-glass opacities scattered throughout most lungs predominantly in the periphery. Upper Abdomen: No acute findings Musculoskeletal: Lower cervical spondylitic changes. No fracture or worrisome bone lesion. IMPRESSION: 1. Patchy ground-glass opacities in the periphery of both lungs, nonspecific. Differential diagnosis includes atypical pneumonia, hypersensitivity pneumonitis, and alveolar hemorrhage. 2. Mediastinal adenopathy, possibly reactive but nonspecific. 3. Coronary and Aortic Atherosclerosis (ICD10-170.0). 4. Coarse subpleural interstitial thickening in both lungs  suggesting interstitial lung disease, with bibasilar bronchiectasis. Electronically Signed   By: Corlis Leak M.D.   On: 12/29/2019 14:48   DG Chest Port 1 View  Result Date: 12/30/2019 CLINICAL DATA:  Shortness of breath, COVID EXAM: PORTABLE CHEST 1 VIEW COMPARISON:  December 29, 2019 FINDINGS: The mediastinal contour and cardiac silhouette are normal. Increased pulmonary interstitium and hazy ground-glass opacities are identified throughout both lungs unchanged. There is no pleural effusion. No acute osseous abnormality is identified. IMPRESSION: Increased pulmonary interstitium and hazy  ground-glass opacities throughout both lungs unchanged compared to prior exam. Electronically Signed   By: Sherian ReinWei-Chen  Lin M.D.   On: 12/30/2019 08:30   DG Chest Port 1 View  Result Date: 12/29/2019 CLINICAL DATA:  SOB, center chest tightness.  Covid-19 positive. EXAM: PORTABLE CHEST - 1 VIEW COMPARISON:  12/27/2019 FINDINGS: Low volumes. Slight worsening of coarse peripheral interstitial and airspace opacities, involving bases more than apices, right greater than left. Heart size and mediastinal contours are within normal limits. No effusion. No pneumothorax. Visualized bones unremarkable. IMPRESSION: Worsening asymmetric infiltrates. Electronically Signed   By: Corlis Leak  Hassell M.D.   On: 12/29/2019 12:57   DG Chest Port 1 View  Result Date: 12/27/2019 CLINICAL DATA:  COVID, cough EXAM: PORTABLE CHEST 1 VIEW COMPARISON:  12/24/2019 FINDINGS: Interstitial prominence again noted throughout the lungs compatible with chronic lung disease/fibrosis. No acute confluent consolidation. No effusions or pneumothorax. Heart is normal size. No acute bony abnormality. IMPRESSION: Stable chronic changes.  No definite acute cardiopulmonary process. Electronically Signed   By: Charlett NoseKevin  Dover M.D.   On: 12/27/2019 19:15   DG CHEST PORT 1 VIEW  Result Date: 12/24/2019 CLINICAL DATA:  Shortness of breath EXAM: PORTABLE CHEST 1 VIEW COMPARISON:   12/23/2019 FINDINGS: Chronic interstitial opacities compatible with chronic lung disease/fibrosis. Pulse heart is normal size. No effusions. No acute bony abnormality. IMPRESSION: Interstitial prominence throughout the lungs, likely chronic/fibrosis. Electronically Signed   By: Charlett NoseKevin  Dover M.D.   On: 12/24/2019 23:37   DG Chest Port 1 View  Result Date: 12/23/2019 CLINICAL DATA:  Respiratory failure.  COVID-19. EXAM: PORTABLE CHEST 1 VIEW COMPARISON:  December 22, 2019 FINDINGS: Bilateral pulmonary infiltrates are stable. The cardiomediastinal silhouette is stable. No pneumothorax. No other acute abnormalities. IMPRESSION: Stable bilateral pulmonary infiltrates consistent with history. Electronically Signed   By: Gerome Samavid  Williams III M.D   On: 12/23/2019 08:06   DG Chest Port 1 View  Result Date: 12/22/2019 CLINICAL DATA:  COVID-19 pneumonia. EXAM: PORTABLE CHEST 1 VIEW COMPARISON:  December 21, 2019. FINDINGS: The heart size and mediastinal contours are within normal limits. No pneumothorax or pleural effusion is noted. Stable right lung opacity is noted consistent with pneumonia. Mild left basilar atelectasis or infiltrate is noted. The visualized skeletal structures are unremarkable. IMPRESSION: Stable right lung opacity consistent with pneumonia. Mild left basilar atelectasis or infiltrate is noted. Electronically Signed   By: Lupita RaiderJames  Green Jr M.D.   On: 12/22/2019 08:46   ECHOCARDIOGRAM COMPLETE  Result Date: 12/23/2019    ECHOCARDIOGRAM REPORT   Patient Name:   Theodore Kim Date of Exam: 12/22/2019 Medical Rec #:  [id number]    Height:       70.0 in Accession #:    [id number]   Weight:       212.5 lb Date of Birth:  27-Jun-1956    BSA:          2.142 m Patient Age:    63 years     BP:           131/87 mmHg Patient Gender: M            HR:           83 bpm. Exam Location:  Inpatient Procedure: 2D Echo, Color Doppler, Cardiac Doppler and Intracardiac            Opacification Agent Indications:    R06.02 SOB   History:        Patient has no prior history of Echocardiogram examinations.  CV19.  Sonographer:    Merrie Roof RDCS Referring Phys: [id number] Hortencia Conradi MEIER  Sonographer Comments: Technically difficult study due to poor echo windows. IMPRESSIONS  1. Left ventricular ejection fraction, by estimation, is 55 to 60%. The left ventricle has normal function. The left ventricle has no regional wall motion abnormalities. There is mild left ventricular hypertrophy. Left ventricular diastolic parameters are consistent with Grade I diastolic dysfunction (impaired relaxation).  2. Right ventricular systolic function is normal. The right ventricular size is normal. Tricuspid regurgitation signal is inadequate for assessing PA pressure.  3. The mitral valve is abnormal. No evidence of mitral valve regurgitation. No evidence of mitral stenosis.  4. Due to suboptimal acoustic windows, cannot exclude a small mobile echodensity on the aortic valve with independent motion. Best seen in clip 36. This might represent a calcification with artifact, cannot exclude mobile calcification or vegetation. The aortic valve is abnormal. Aortic valve regurgitation is trivial. No aortic stenosis is present. FINDINGS  Left Ventricle: Left ventricular ejection fraction, by estimation, is 55 to 60%. The left ventricle has normal function. The left ventricle has no regional wall motion abnormalities. Definity contrast agent was given IV to delineate the left ventricular  endocardial borders. The left ventricular internal cavity size was normal in size. There is mild left ventricular hypertrophy. Left ventricular diastolic parameters are consistent with Grade I diastolic dysfunction (impaired relaxation). Right Ventricle: The right ventricular size is normal. No increase in right ventricular wall thickness. Right ventricular systolic function is normal. Tricuspid regurgitation signal is inadequate for assessing PA pressure. Left  Atrium: Left atrial size was normal in size. Right Atrium: Right atrial size was normal in size. Pericardium: There is no evidence of pericardial effusion. Mitral Valve: The mitral valve is abnormal. Normal mobility of the mitral valve leaflets. Mild mitral annular calcification. No evidence of mitral valve regurgitation. No evidence of mitral valve stenosis. Tricuspid Valve: The tricuspid valve is normal in structure. Tricuspid valve regurgitation is trivial. No evidence of tricuspid stenosis. Aortic Valve: Due to suboptimal acoustic windows, cannot exclude a small mobile echodensity on the aortic valve with independent motion. Best seen in clip 36. This might represent a calcification with artifact, cannot exclude mobile calcification or vegetation. The aortic valve is abnormal. Aortic valve regurgitation is trivial. No aortic stenosis is present. There is mild calcification of the aortic valve. Pulmonic Valve: The pulmonic valve was not well visualized. Pulmonic valve regurgitation is trivial. No evidence of pulmonic stenosis. Aorta: The aortic root is normal in size and structure. Venous: The inferior vena cava was not well visualized. IAS/Shunts: The interatrial septum was not well visualized.  LEFT VENTRICLE PLAX 2D LVIDd:         3.30 cm     Diastology LVIDs:         2.30 cm     LV e' lateral:   12.30 cm/s LV PW:         1.20 cm     LV E/e' lateral: 5.5 LV IVS:        1.20 cm     LV e' medial:    6.42 cm/s                            LV E/e' medial:  10.6  LV Volumes (MOD) LV vol d, MOD A4C: 62.7 ml LV vol s, MOD A4C: 26.9 ml LV SV MOD A4C:     62.7 ml RIGHT VENTRICLE  RV Basal diam:  3.50 cm RV S prime:     14.30 cm/s TAPSE (M-mode): 1.9 cm LEFT ATRIUM             Index       RIGHT ATRIUM           Index LA diam:        3.10 cm 1.45 cm/m  RA Area:     18.70 cm LA Vol (A2C):   56.8 ml 26.52 ml/m RA Volume:   46.90 ml  21.90 ml/m LA Vol (A4C):   48.4 ml 22.60 ml/m LA Biplane Vol: 55.3 ml 25.82 ml/m  AORTIC  VALVE LVOT Vmax:   77.70 cm/s LVOT Vmean:  49.400 cm/s LVOT VTI:    0.144 m  AORTA Ao Root diam: 2.70 cm MITRAL VALVE MV Area (PHT): 3.08 cm    SHUNTS MV Decel Time: 246 msec    Systemic VTI: 0.14 m MV E velocity: 68.10 cm/s MV A velocity: 79.30 cm/s MV E/A ratio:  0.86 Weston Brass MD Electronically signed by Weston Brass MD Signature Date/Time: 12/23/2019/10:12:24 AM    Final    ECHOCARDIOGRAM LIMITED  Result Date: 12/30/2019    ECHOCARDIOGRAM LIMITED REPORT   Patient Name:   Theodore Kim Date of Exam: 12/30/2019 Medical Rec #:  [id number]    Height:       70.0 in Accession #:    [id number]   Weight:       185.4 lb Date of Birth:  Feb 29, 1956    BSA:          2.021 m Patient Age:    63 years     BP:           99/86 mmHg Patient Gender: M            HR:           96 bpm. Exam Location:  Inpatient Procedure: Limited Echo, Limited Color Doppler and Cardiac Doppler Indications:    acute diastolic CHF 428.31  History:        Patient has prior history of Echocardiogram examinations, most                 recent 12/22/2019. Covid.  Sonographer:    Delcie Roch Referring Phys: Heide Scales [facility]  Sonographer Comments: Technically difficult study due to poor echo windows. Image acquisition challenging due to respiratory motion. IMPRESSIONS  1. Technically difficult echo with poor image quality.  2. Left ventricular ejection fraction, by estimation, is 55 to 60%. The left ventricle has normal function.  3. The aortic valve is grossly normal. Aortic valve regurgitation is not visualized. No aortic stenosis is present. FINDINGS  Left Ventricle: Left ventricular ejection fraction, by estimation, is 55 to 60%. The left ventricle has normal function. Aortic Valve: The aortic valve is grossly normal. Aortic valve regurgitation is not visualized. No aortic stenosis is present. Aortic valve mean gradient measures 2.5 mmHg. Aortic valve peak gradient measures 4.4 mmHg. Aortic valve area, by VTI measures 1.79 cm.  Additional Comments: Technically difficult echo with poor image quality.  LEFT VENTRICLE PLAX 2D LVOT diam:     1.80 cm LV SV:         27 LV SV Index:   13 LVOT Area:     2.54 cm  AORTIC VALVE AV Area (Vmax):    1.80 cm AV Area (Vmean):   1.74 cm AV Area (VTI):     1.79 cm AV Vmax:  104.50 cm/s AV Vmean:          71.100 cm/s AV VTI:            0.151 m AV Peak Grad:      4.4 mmHg AV Mean Grad:      2.5 mmHg LVOT Vmax:         73.80 cm/s LVOT Vmean:        48.500 cm/s LVOT VTI:          0.106 m LVOT/AV VTI ratio: 0.70  AORTA Ao Root diam: 3.40 cm  SHUNTS Systemic VTI:  0.11 m Systemic Diam: 1.80 cm Kristeen Miss MD Electronically signed by Kristeen Miss MD Signature Date/Time: 12/30/2019/2:43:44 PM    Final

## 2020-01-03 DIAGNOSIS — J9601 Acute respiratory failure with hypoxia: Secondary | ICD-10-CM

## 2020-01-03 LAB — CBC
HCT: 44.1 % (ref 39.0–52.0)
Hemoglobin: 15 g/dL (ref 13.0–17.0)
MCH: 30.6 pg (ref 26.0–34.0)
MCHC: 34 g/dL (ref 30.0–36.0)
MCV: 90 fL (ref 80.0–100.0)
Platelets: 188 10*3/uL (ref 150–400)
RBC: 4.9 MIL/uL (ref 4.22–5.81)
RDW: 13.2 % (ref 11.5–15.5)
WBC: 9.7 10*3/uL (ref 4.0–10.5)
nRBC: 0 % (ref 0.0–0.2)

## 2020-01-03 LAB — CULTURE, BLOOD (ROUTINE X 2)
Culture: NO GROWTH
Culture: NO GROWTH
Special Requests: ADEQUATE

## 2020-01-03 LAB — BASIC METABOLIC PANEL
Anion gap: 9 (ref 5–15)
BUN: 15 mg/dL (ref 8–23)
CO2: 23 mmol/L (ref 22–32)
Calcium: 7.9 mg/dL — ABNORMAL LOW (ref 8.9–10.3)
Chloride: 103 mmol/L (ref 98–111)
Creatinine, Ser: 1.11 mg/dL (ref 0.61–1.24)
GFR calc Af Amer: >60 mL/min (ref >60)
GFR calc non Af Amer: >60 mL/min (ref >60)
Glucose, Bld: 95 mg/dL (ref 70–99)
Potassium: 3.7 mmol/L (ref 3.5–5.1)
Sodium: 135 mmol/L (ref 135–145)

## 2020-01-03 LAB — GLUCOSE, CAPILLARY
Glucose-Capillary: 101 mg/dL — ABNORMAL HIGH (ref 70–99)
Glucose-Capillary: 158 mg/dL — ABNORMAL HIGH (ref 70–99)

## 2020-01-03 MED ORDER — ALBUTEROL SULFATE HFA 108 (90 BASE) MCG/ACT IN AERS: 2.0000 | INHALATION_SPRAY | RESPIRATORY_TRACT | 0 refills | Status: DC | PRN

## 2020-01-03 MED ORDER — ALBUTEROL SULFATE HFA 108 (90 BASE) MCG/ACT IN AERS: 2.0000 | INHALATION_SPRAY | RESPIRATORY_TRACT | 0 refills | Status: AC | PRN

## 2020-01-03 MED FILL — ALBUTEROL SULFATE HFA 108 (: 108 (90 BAS | 20 days supply | Qty: 18 | Fill #0

## 2020-01-03 NOTE — Progress Notes (Signed)
D/c instructions provided to pt. He verbalizes understanding. 4 wheel walker, 3 in 1, and oxygen all at the bedside. Pt awaiting for medication to be delivered to room and then pt will be ready to leave. Pt has no further questions at this time.

## 2020-01-03 NOTE — Progress Notes (Signed)
PT Cancellation Note  Patient Details Name: Theodore Kim MRN: [id number] DOB: June 06, 1956   Cancelled Treatment:    Reason Eval/Treat Not Completed: Other (comment)(patient declined, going home today) Patient declined wanting to practice anything mobility wise prior to discharge. Patient denied having any questions for PT. 4WW/rollator and BSC had been delivered to room. Nurse in room going over discharge paperwork.  [name] 01/03/2020, 10:47 AM

## 2020-01-03 NOTE — Discharge Summary (Signed)
Theodore Kim TTS:[id number] DOB: [birth year] DOA: 12/21/2019  PCP: No primary care provider on file.  Admit date: 12/21/2019  Discharge date: 01/03/2020  Admitted From: Home   Disposition:  Home   Recommendations for Outpatient Follow-up:   Follow up with PCP in 1-2 weeks  PCP Please obtain BMP/CBC, 2 view CXR in 1week,  (see Discharge instructions)   PCP Please follow up on the following pending results: Check CBC, CMP, 2 view chest x-ray in 7 to 10 days.  Follow CT and echocardiogram report.   Home Health: PT,RN   Equipment/Devices: 3 and 1, rolling walker, 2 L nasal cannula oxygen as needed Consultations: None  Discharge Condition: Stable    CODE STATUS: Full    Diet Recommendation: Heart Healthy Low Carb  Diet Order            Diet heart healthy/carb modified Room service appropriate? Yes; Fluid consistency: Thin  Diet effective now               CC - SOB   Brief history of present illness from the day of admission and additional interim summary    Patient is a 64 y.o. male with PMHx of DM-2, HTN who was diagnosed with COVID-19 on 3/17-transferred from [facility] to [facility] ICU for worsening hypoxemia.  He was managed in the [facility] upon stability transfer to the [facility].  See below for further details.  Significant Events: 3/19>> admit to [facility], ICU for worsening hypoxemia-initially requiring 15 L of HFNC TTE x 2  EF 55-60% 3/22>> transferred to [facility]  COVID-19 medications: Remdesivir: 3/19>>3/23 Steroids: 3/19>> Actemra: 3/19 x 1                                                                 Hospital Course   Acute Hypoxic Resp Failure due to Covid 19 Viral pneumonia: He had severe disease with extreme hypoxia requiring 15 L high flow oxygen, he was treated  appropriately with IV Actemra, remdesivir and steroids, he is now symptom-free at room air upon ambulation he requires 1 to 2 L oxygen as needed intermittently, at this time he has finished all his treatment and will be discharged home with outpatient PCP follow-up in a week..     Recent Labs  Lab 12/28/19 0514 12/29/19 0307 12/30/19 0243 12/30/19 0415 12/31/19 0502  CRP 0.6 0.6 0.6  --  <0.5  DDIMER  --  0.89*  --  1.05* 0.65*  FERRITIN 1,178* 1,213*  --   --   --   BNP  --  175.2* 179.8*  --  33.5  PROCALCITON  --  <0.10 <0.10  --  0.21    Hepatic Function Latest Ref Rng & Units 12/31/2019 12/30/2019 12/29/2019  Total Protein 6.5 - 8.1 g/dL 6.8 7.7  7.2  Albumin 3.5 - 5.0 g/dL 2.9(L) 3.2(L) 3.0(L)  AST 15 - 41 U/L 43(H) 37 37  ALT 0 - 44 U/L 60(H) 60(H) 43  Alk Phosphatase 38 - 126 U/L 143(H) 163(H) 146(H)  Total Bilirubin 0.3 - 1.2 mg/dL 2.6(H) 2.5(H) 2.0(H)    Hemoptysis: 1 episode on 3/25-appears to be mild-none since then.    Likely due to bronchial irritation from coughing, CT angiogram nonacute, PCP to follow and monitor.  Worsening polycythemia:    Stable erythropoietin level this was due to dehydration and has resolved.  HTN: Stable continue home regimen.  Possible aortic valve density noted on echocardiogram done in the ICU on 12/24/2019. Clinically no signs of bacterial infection or bacteremia, repeat echocardiogram limited done on 12/30/2019 shows normal aortic valve.  Negative blood cultures and stable procalcitonin.  Hyponatremia.    Due to dehydration resolved with IV fluids.  DM-2 (A1c 8.3 on 12/21/2019) poor outpatient control due to hyperglycemia  : Continue home regimen and follow with PCP for glycemic control and A1c monitoring.   Discharge diagnosis     Active Problems:   Pneumonia due to COVID-19 virus   Acute respiratory failure with hypoxia [facility])    Discharge instructions    Discharge Instructions    Ambulatory referral to Pulmonology    Complete by: As directed    Severe covid with hypoxia-needs follow with Dr Vaughan Browner   Reason for referral: Other   Discharge instructions   Complete by: As directed    Follow with Primary MD in 7 days   Get CBC, CMP, 2 view Chest X ray -  checked next visit within 1 week by Primary MD    Activity: With 24 x 7 assistance and supervision for 2 weeks, use walker as needed.  Disposition Home   Diet: Heart Healthy  Low Carb  Special Instructions: If you have smoked or chewed Tobacco  in the last 2 yrs please stop smoking, stop any regular Alcohol  and or any Recreational drug use.  On your next visit with your primary care physician please Get Medicines reviewed and adjusted.  Please request your Prim.MD to go over all Hospital Tests and Procedure/Radiological results at the follow up, please get all Hospital records sent to your Prim MD by signing hospital release before you go home.  If you experience worsening of your admission symptoms, develop shortness of breath, life threatening emergency, suicidal or homicidal thoughts you must seek medical attention immediately by calling 911 or calling your MD immediately  if symptoms less severe.  You Must read complete instructions/literature along with all the possible adverse reactions/side effects for all the Medicines you take and that have been prescribed to you. Take any new Medicines after you have completely understood and accpet all the possible adverse reactions/side effects.   Increase activity slowly   Complete by: As directed    MyChart COVID-19 home monitoring program   Complete by: Jan 03, 2020    Is the patient willing to use the [facility] for home monitoring?: Yes   Temperature monitoring   Complete by: Jan 03, 2020    After how many days would you like to receive a notification of this patient's flowsheet entries?: 1      Discharge Medications   Allergies as of 01/03/2020   No Known Allergies     Medication List     STOP taking these medications   naproxen 500 MG tablet Commonly known as: NAPROSYN  TAKE these medications   albuterol 108 (90 Base) MCG/ACT inhaler Commonly known as: VENTOLIN HFA Inhale 2 puffs into the lungs every 4 (four) hours as needed for wheezing or shortness of breath.   allopurinol 300 MG tablet Commonly known as: ZYLOPRIM Take 300 mg by mouth daily.   amLODipine 5 MG tablet Commonly known as: NORVASC Take 5 mg by mouth daily.   aspirin 81 MG chewable tablet Chew 81 mg by mouth daily.   furosemide 20 MG tablet Commonly known as: LASIX Take 20 mg by mouth daily as needed for fluid.   glimepiride 2 MG tablet Commonly known as: AMARYL Take 2 mg by mouth every morning.   ibuprofen 800 MG tablet Commonly known as: ADVIL Take 800 mg by mouth 3 (three) times daily as needed for headache, moderate pain or cramping.   lisinopril 20 MG tablet Commonly known as: ZESTRIL Take 20 mg by mouth daily.   metFORMIN 1000 MG tablet Commonly known as: GLUCOPHAGE Take 1,000 mg by mouth 2 (two) times daily with a meal.   metoprolol tartrate 50 MG tablet Commonly known as: LOPRESSOR Take 50 mg by mouth 2 (two) times daily.            Durable Medical Equipment  (From admission, onward)         Start     Ordered   01/03/20 1022  For home use only DME oxygen  Once    Comments: 2 L as needed at rest and ambulation  Question Answer Comment  Length of Need 6 Months   Mode or (Route) Nasal cannula   Liters per Minute 2   Frequency Continuous (stationary and portable oxygen unit needed)   Oxygen conserving device Yes   Oxygen delivery system Gas      01/03/20 1022   01/03/20 0841  For home use only DME 3 n 1  Once     01/03/20 0841   01/02/20 1041  For home use only DME 4 wheeled rolling walker with seat  Once    Comments: Per therapy pt needs rollator instead of RW  Question:  Patient needs a walker to treat with the following condition  Answer:  Mobility  impaired   01/02/20 1041          Follow-up Information    Your PCP. Schedule an appointment as soon as possible for a visit in 1 week(s).        [facility] Follow up.   Specialty: Home Health Services Why: Physical and Occupational therapy Contact information: [place] [address] [phone]        [place] Follow up.   Why: home oxygen,rolling walker with a seat, bedside commode          Major procedures and Radiology Reports - PLEASE review detailed and final reports thoroughly  -      CT CHEST W CONTRAST  Result Date: 12/29/2019 CLINICAL DATA:  Hemoptysis. COVID EXAM: CT CHEST WITH CONTRAST TECHNIQUE: Multidetector CT imaging of the chest was performed during intravenous contrast administration. CONTRAST:  25m OMNIPAQUE IOHEXOL 300 MG/ML  SOLN COMPARISON:  None. FINDINGS: Cardiovascular: Heart size normal. No pericardial effusion. Coronary calcifications. Aortic Atherosclerosis (ICD10-170.0). Mediastinum/Nodes: Enlarged AP window and right paratracheal nodes up to 1.5 cm short axis diameter. No definite hilar adenopathy. Lungs/Pleura: No pleural effusion. No pneumothorax. Bronchiectasis posteriorly in both lower lobes. Coarse subpleural interstitial thickening in both lungs. Patchy ground-glass opacities scattered throughout most lungs predominantly in  the periphery. Upper Abdomen: No acute findings Musculoskeletal: Lower cervical spondylitic changes. No fracture or worrisome bone lesion. IMPRESSION: 1. Patchy ground-glass opacities in the periphery of both lungs, nonspecific. Differential diagnosis includes atypical pneumonia, hypersensitivity pneumonitis, and alveolar hemorrhage. 2. Mediastinal adenopathy, possibly reactive but nonspecific. 3. Coronary and Aortic Atherosclerosis (ICD10-170.0). 4. Coarse subpleural interstitial thickening in both lungs suggesting interstitial lung disease, with bibasilar bronchiectasis. Electronically  Signed   By: Lucrezia Europe M.D.   On: 12/29/2019 14:48   DG Chest Port 1 View  Result Date: 12/30/2019 CLINICAL DATA:  Shortness of breath, COVID EXAM: PORTABLE CHEST 1 VIEW COMPARISON:  December 29, 2019 FINDINGS: The mediastinal contour and cardiac silhouette are normal. Increased pulmonary interstitium and hazy ground-glass opacities are identified throughout both lungs unchanged. There is no pleural effusion. No acute osseous abnormality is identified. IMPRESSION: Increased pulmonary interstitium and hazy ground-glass opacities throughout both lungs unchanged compared to prior exam. Electronically Signed   By: Abelardo Diesel M.D.   On: 12/30/2019 08:30   DG Chest Port 1 View  Result Date: 12/29/2019 CLINICAL DATA:  SOB, center chest tightness.  Covid-19 positive. EXAM: PORTABLE CHEST - 1 VIEW COMPARISON:  12/27/2019 FINDINGS: Low volumes. Slight worsening of coarse peripheral interstitial and airspace opacities, involving bases more than apices, right greater than left. Heart size and mediastinal contours are within normal limits. No effusion. No pneumothorax. Visualized bones unremarkable. IMPRESSION: Worsening asymmetric infiltrates. Electronically Signed   By: Lucrezia Europe M.D.   On: 12/29/2019 12:57   DG Chest Port 1 View  Result Date: 12/27/2019 CLINICAL DATA:  COVID, cough EXAM: PORTABLE CHEST 1 VIEW COMPARISON:  12/24/2019 FINDINGS: Interstitial prominence again noted throughout the lungs compatible with chronic lung disease/fibrosis. No acute confluent consolidation. No effusions or pneumothorax. Heart is normal size. No acute bony abnormality. IMPRESSION: Stable chronic changes.  No definite acute cardiopulmonary process. Electronically Signed   By: Rolm Baptise M.D.   On: 12/27/2019 19:15   DG CHEST PORT 1 VIEW  Result Date: 12/24/2019 CLINICAL DATA:  Shortness of breath EXAM: PORTABLE CHEST 1 VIEW COMPARISON:  12/23/2019 FINDINGS: Chronic interstitial opacities compatible with chronic lung  disease/fibrosis. Pulse heart is normal size. No effusions. No acute bony abnormality. IMPRESSION: Interstitial prominence throughout the lungs, likely chronic/fibrosis. Electronically Signed   By: Rolm Baptise M.D.   On: 12/24/2019 23:37   DG Chest Port 1 View  Result Date: 12/23/2019 CLINICAL DATA:  Respiratory failure.  COVID-19. EXAM: PORTABLE CHEST 1 VIEW COMPARISON:  December 22, 2019 FINDINGS: Bilateral pulmonary infiltrates are stable. The cardiomediastinal silhouette is stable. No pneumothorax. No other acute abnormalities. IMPRESSION: Stable bilateral pulmonary infiltrates consistent with history. Electronically Signed   By: Dorise Bullion III M.D   On: 12/23/2019 08:06   DG Chest Port 1 View  Result Date: 12/22/2019 CLINICAL DATA:  COVID-19 pneumonia. EXAM: PORTABLE CHEST 1 VIEW COMPARISON:  December 21, 2019. FINDINGS: The heart size and mediastinal contours are within normal limits. No pneumothorax or pleural effusion is noted. Stable right lung opacity is noted consistent with pneumonia. Mild left basilar atelectasis or infiltrate is noted. The visualized skeletal structures are unremarkable. IMPRESSION: Stable right lung opacity consistent with pneumonia. Mild left basilar atelectasis or infiltrate is noted. Electronically Signed   By: Marijo Conception M.D.   On: 12/22/2019 08:46   ECHOCARDIOGRAM COMPLETE  Result Date: 12/23/2019    ECHOCARDIOGRAM REPORT   Patient Name:   Theodore Kim Date of Exam: 12/22/2019 Medical Rec #:  [id number]  Height:       70.0 in Accession #:    [id number]   Weight:       212.5 lb Date of Birth:  04-Sep-1956    BSA:          2.142 m Patient Age:    65 years     BP:           131/87 mmHg Patient Gender: M            HR:           83 bpm. Exam Location:  Inpatient Procedure: 2D Echo, Color Doppler, Cardiac Doppler and Intracardiac            Opacification Agent Indications:    R06.02 SOB  History:        Patient has no prior history of Echocardiogram examinations.                  CV19.  Sonographer:    Merrie Roof RDCS Referring Phys: [id number] [facility]  Sonographer Comments: Technically difficult study due to poor echo windows. IMPRESSIONS  1. Left ventricular ejection fraction, by estimation, is 55 to 60%. The left ventricle has normal function. The left ventricle has no regional wall motion abnormalities. There is mild left ventricular hypertrophy. Left ventricular diastolic parameters are consistent with Grade I diastolic dysfunction (impaired relaxation).  2. Right ventricular systolic function is normal. The right ventricular size is normal. Tricuspid regurgitation signal is inadequate for assessing PA pressure.  3. The mitral valve is abnormal. No evidence of mitral valve regurgitation. No evidence of mitral stenosis.  4. Due to suboptimal acoustic windows, cannot exclude a small mobile echodensity on the aortic valve with independent motion. Best seen in clip 36. This might represent a calcification with artifact, cannot exclude mobile calcification or vegetation. The aortic valve is abnormal. Aortic valve regurgitation is trivial. No aortic stenosis is present. FINDINGS  Left Ventricle: Left ventricular ejection fraction, by estimation, is 55 to 60%. The left ventricle has normal function. The left ventricle has no regional wall motion abnormalities. Definity contrast agent was given IV to delineate the left ventricular  endocardial borders. The left ventricular internal cavity size was normal in size. There is mild left ventricular hypertrophy. Left ventricular diastolic parameters are consistent with Grade I diastolic dysfunction (impaired relaxation). Right Ventricle: The right ventricular size is normal. No increase in right ventricular wall thickness. Right ventricular systolic function is normal. Tricuspid regurgitation signal is inadequate for assessing PA pressure. Left Atrium: Left atrial size was normal in size. Right Atrium: Right atrial size was normal  in size. Pericardium: There is no evidence of pericardial effusion. Mitral Valve: The mitral valve is abnormal. Normal mobility of the mitral valve leaflets. Mild mitral annular calcification. No evidence of mitral valve regurgitation. No evidence of mitral valve stenosis. Tricuspid Valve: The tricuspid valve is normal in structure. Tricuspid valve regurgitation is trivial. No evidence of tricuspid stenosis. Aortic Valve: Due to suboptimal acoustic windows, cannot exclude a small mobile echodensity on the aortic valve with independent motion. Best seen in clip 36. This might represent a calcification with artifact, cannot exclude mobile calcification or vegetation. The aortic valve is abnormal. Aortic valve regurgitation is trivial. No aortic stenosis is present. There is mild calcification of the aortic valve. Pulmonic Valve: The pulmonic valve was not well visualized. Pulmonic valve regurgitation is trivial. No evidence of pulmonic stenosis. Aorta: The aortic root is normal in size and structure. Venous: The  inferior vena cava was not well visualized. IAS/Shunts: The interatrial septum was not well visualized.  LEFT VENTRICLE PLAX 2D LVIDd:         3.30 cm     Diastology LVIDs:         2.30 cm     LV e' lateral:   12.30 cm/s LV PW:         1.20 cm     LV E/e' lateral: 5.5 LV IVS:        1.20 cm     LV e' medial:    6.42 cm/s                            LV E/e' medial:  10.6  LV Volumes (MOD) LV vol d, MOD A4C: 62.7 ml LV vol s, MOD A4C: 26.9 ml LV SV MOD A4C:     62.7 ml RIGHT VENTRICLE RV Basal diam:  3.50 cm RV S prime:     14.30 cm/s TAPSE (M-mode): 1.9 cm LEFT ATRIUM             Index       RIGHT ATRIUM           Index LA diam:        3.10 cm 1.45 cm/m  RA Area:     18.70 cm LA Vol (A2C):   56.8 ml 26.52 ml/m RA Volume:   46.90 ml  21.90 ml/m LA Vol (A4C):   48.4 ml 22.60 ml/m LA Biplane Vol: 55.3 ml 25.82 ml/m  AORTIC VALVE LVOT Vmax:   77.70 cm/s LVOT Vmean:  49.400 cm/s LVOT VTI:    0.144 m  AORTA Ao  Root diam: 2.70 cm MITRAL VALVE MV Area (PHT): 3.08 cm    SHUNTS MV Decel Time: 246 msec    Systemic VTI: 0.14 m MV E velocity: 68.10 cm/s MV A velocity: 79.30 cm/s MV E/A ratio:  0.86 Cherlynn Kaiser MD Electronically signed by Cherlynn Kaiser MD Signature Date/Time: 12/23/2019/10:12:24 AM    Final    ECHOCARDIOGRAM LIMITED  Result Date: 12/30/2019    ECHOCARDIOGRAM LIMITED REPORT   Patient Name:   Theodore Kim Date of Exam: 12/30/2019 Medical Rec #:  [id number]    Height:       70.0 in Accession #:    [id number]   Weight:       185.4 lb Date of Birth:  17-Dec-1955    BSA:          2.021 m Patient Age:    38 years     BP:           99/86 mmHg Patient Gender: M            HR:           96 bpm. Exam Location:  Inpatient Procedure: Limited Echo, Limited Color Doppler and Cardiac Doppler Indications:    acute diastolic CHF 950.93  History:        Patient has prior history of Echocardiogram examinations, most                 recent 12/22/2019. Covid.  Sonographer:    Johny Chess Referring Phys: Graylin Shiver [facility]  Sonographer Comments: Technically difficult study due to poor echo windows. Image acquisition challenging due to respiratory motion. IMPRESSIONS  1. Technically difficult echo with poor image quality.  2. Left ventricular ejection fraction, by estimation, is 55 to 60%. The left  ventricle has normal function.  3. The aortic valve is grossly normal. Aortic valve regurgitation is not visualized. No aortic stenosis is present. FINDINGS  Left Ventricle: Left ventricular ejection fraction, by estimation, is 55 to 60%. The left ventricle has normal function. Aortic Valve: The aortic valve is grossly normal. Aortic valve regurgitation is not visualized. No aortic stenosis is present. Aortic valve mean gradient measures 2.5 mmHg. Aortic valve peak gradient measures 4.4 mmHg. Aortic valve area, by VTI measures 1.79 cm. Additional Comments: Technically difficult echo with poor image quality.  LEFT VENTRICLE  PLAX 2D LVOT diam:     1.80 cm LV SV:         27 LV SV Index:   13 LVOT Area:     2.54 cm  AORTIC VALVE AV Area (Vmax):    1.80 cm AV Area (Vmean):   1.74 cm AV Area (VTI):     1.79 cm AV Vmax:           104.50 cm/s AV Vmean:          71.100 cm/s AV VTI:            0.151 m AV Peak Grad:      4.4 mmHg AV Mean Grad:      2.5 mmHg LVOT Vmax:         73.80 cm/s LVOT Vmean:        48.500 cm/s LVOT VTI:          0.106 m LVOT/AV VTI ratio: 0.70  AORTA Ao Root diam: 3.40 cm  SHUNTS Systemic VTI:  0.11 m Systemic Diam: 1.80 cm Mertie Moores MD Electronically signed by Mertie Moores MD Signature Date/Time: 12/30/2019/2:43:44 PM    Final     Micro Results     Recent Results (from the past 240 hour(s))  MRSA PCR Screening     Status: None   Collection Time: 12/25/19  4:26 AM   Specimen: Nasal Mucosa; Nasopharyngeal  Result Value Ref Range Status   MRSA by PCR NEGATIVE NEGATIVE Final    Comment:        The GeneXpert MRSA Assay (FDA approved for NASAL specimens only), is one component of a comprehensive MRSA colonization surveillance program. It is not intended to diagnose MRSA infection nor to guide or monitor treatment for MRSA infections. Performed at [facility], [place] [address] [place], [place] [address]   Culture, blood (routine x 2)     Status: None   Collection Time: 12/29/19 12:52 PM   Specimen: BLOOD  Result Value Ref Range Status   Specimen Description BLOOD RIGHT ANTECUBITAL  Final   Special Requests   Final    AEROBIC BOTTLE ONLY Blood Culture results may not be optimal due to an inadequate volume of blood received in culture bottles   Culture   Final    NO GROWTH 5 DAYS Performed at [facility], [place] [address] [place], [place] [address]    Report Status 01/03/2020 FINAL  Final  Culture, blood (routine x 2)     Status: None   Collection Time: 12/29/19  1:00 PM   Specimen: BLOOD RIGHT HAND  Result Value Ref Range Status   Specimen Description BLOOD RIGHT HAND   Final   Special Requests   Final    BOTTLES DRAWN AEROBIC AND ANAEROBIC Blood Culture adequate volume   Culture   Final    NO GROWTH 5 DAYS Performed at [facility], [place]  [address], [place], [place] [address]    Report Status 01/03/2020 FINAL  Final    Today   Subjective    Theodore Kim today has no headache,no chest abdominal pain,no new weakness tingling or numbness, feels much better wants to go home today.     Objective   Blood pressure 128/89, pulse 80, temperature (!) 97.2 F (36.2 C), temperature source Oral, resp. rate 18, height 5' 10"  (1.778 m), weight 87.2 kg, SpO2 98 %.   Intake/Output Summary (Last 24 hours) at 01/03/2020 1022 Last data filed at 01/03/2020 0950 Gross per 24 hour  Intake 2043.65 ml  Output 725 ml  Net 1318.65 ml    Exam  Awake Alert,   No new F.N deficits, Normal affect [place].AT,PERRAL Supple Neck,No JVD, No cervical lymphadenopathy appriciated.  Symmetrical Chest wall movement, Good air movement bilaterally, CTAB RRR,No Gallops,Rubs or new Murmurs, No Parasternal Heave +ve B.Sounds, Abd Soft, Non tender, No organomegaly appriciated, No rebound -guarding or rigidity. No Cyanosis, Clubbing or edema, No new Rash or bruise   Data Review   CBC w Diff:  Lab Results  Component Value Date   WBC 9.7 01/03/2020   HGB 15.0 01/03/2020   HCT 44.1 01/03/2020   PLT 188 01/03/2020   LYMPHOPCT 17 01/02/2020   MONOPCT 8 01/02/2020   EOSPCT 1 01/02/2020   BASOPCT 1 01/02/2020    CMP:  Lab Results  Component Value Date   NA 135 01/03/2020   K 3.7 01/03/2020   CL 103 01/03/2020   CO2 23 01/03/2020   BUN 15 01/03/2020   CREATININE 1.11 01/03/2020   PROT 6.8 12/31/2019   ALBUMIN 2.9 (L) 12/31/2019   BILITOT 2.6 (H) 12/31/2019   ALKPHOS 143 (H) 12/31/2019   AST 43 (H) 12/31/2019   ALT 60 (H) 12/31/2019  .   Total Time in preparing paper work, data evaluation and todays exam - 43 minutes  Lala Lund M.D on 01/03/2020 at 10:22  AM  [facility]   Office  [phone]

## 2020-01-03 NOTE — TOC Transition Note (Addendum)
Transition of Care [facility]) - CM/SW Discharge Note   Patient Details  Name: Theodore Kim MRN: [id number] Date of Birth: 1956/06/07  [facility] CM/SW Contact:  Cherylann Parr, RN Phone Number: 01/03/2020, 9:03 AM   Clinical Narrative:   Pt deemed stable for discharge home today - discharge order written.  CM confirmed with both pt and wife; home oxygen equipment has been delivered, PCP appt will be made this evening, wife will provide recommended 24 hour supervision. CM informed [facility] that pt will discharge today - [facility] confirms they will provide ordered [facility].  [facility] accepts 3:1 and rollator referral via charity.  [facility] has already delivered portable oxygen tanks for home transport to room.   CM awaiting signing off until medication reconciliation is complete as pt may require MATCH - bedside nurse aware.  CM requested attending to send discharge prescriptions to [facility] pharmacy  Update:  Medication reconciliation complete.  CM provided MATCH and [facility] pharmacy will deliver discharge meds as currentt amount would cause hardship for pt.  CM signing off    Final next level of care: Home w Home Health Services Barriers to Discharge: Barriers Resolved   Patient Goals and CMS Choice     Choice offered to / list presented to : Patient, Spouse  Discharge Placement                       Discharge Plan and Services     Post Acute Care Choice: Home Health, Durable Medical Equipment          DME Arranged: Oxygen, Walker rolling DME Agency: [facility] Date DME Agency Contacted: 12/27/19 Time DME Agency Contacted: 1137 Representative spoke with at DME Agency: Zack HH Arranged: OT, PT HH Agency: [facility] Date HH Agency Contacted: 12/27/19 Time HH Agency Contacted: 1137 Representative spoke with at [facility] Agency: Cassie  Social Determinants of Health (SDOH) Interventions     Readmission Risk Interventions No flowsheet data found.

## 2020-08-25 IMAGING — CT CT CHEST W/ CM
2 of 3 series · 15 of 36 positions shown, 18 images · IV contrast (APPLIED)
Comparison: None.

CLINICAL DATA: Hemoptysis. COVID

EXAM:
CT CHEST WITH CONTRAST
TECHNIQUE: Multidetector CT imaging of the chest was performed during
intravenous contrast administration.
CONTRAST:  75mL OMNIPAQUE IOHEXOL 300 MG/ML  SOLN

[Series 3: thorax 2.0 i31f 2 · axial · 0.75mm/px · z∈[+1349,+1581]mm · 12 of 138 slices shown, 15 images]
[im 11/138  mediastinal]
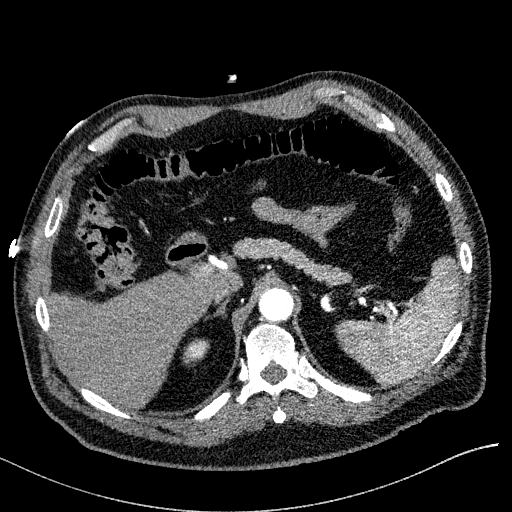
[im 11/138  lung]
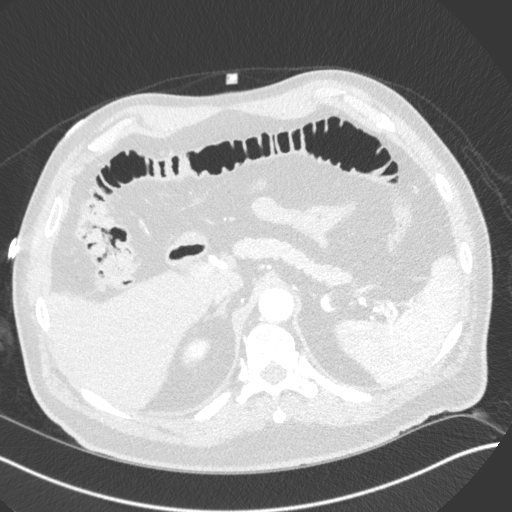
[im 21/138  lung]
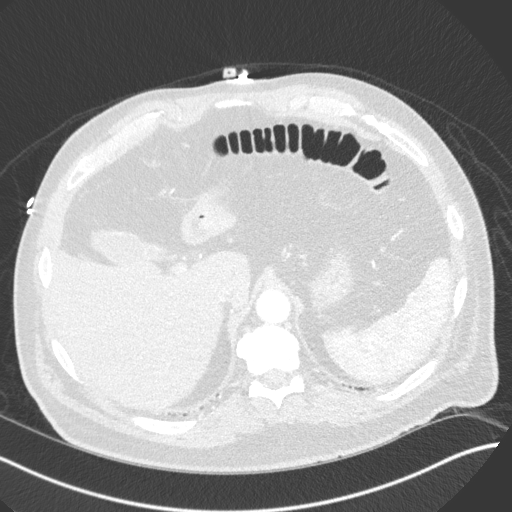
[im 31/138  lung]
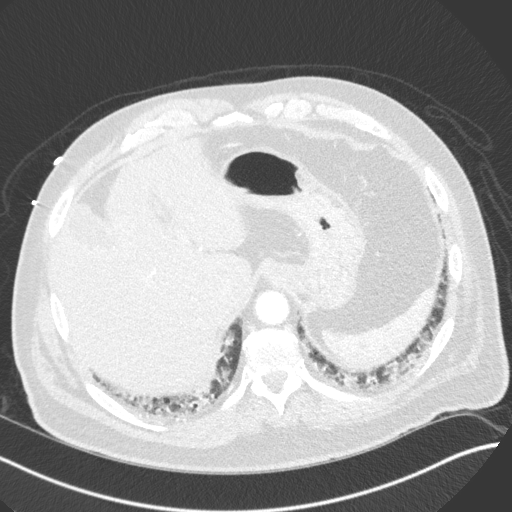
[im 41/138  lung]
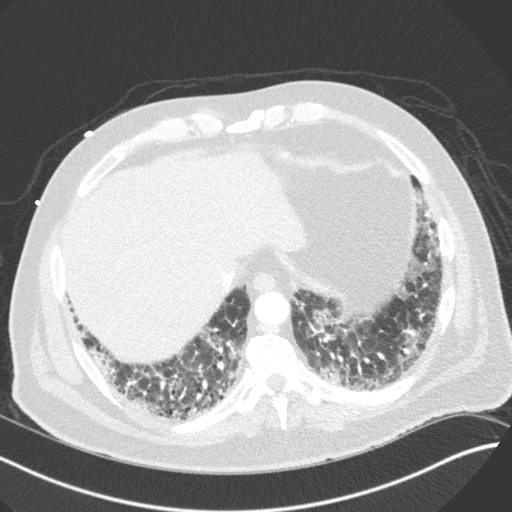
[im 51/138  mediastinal]
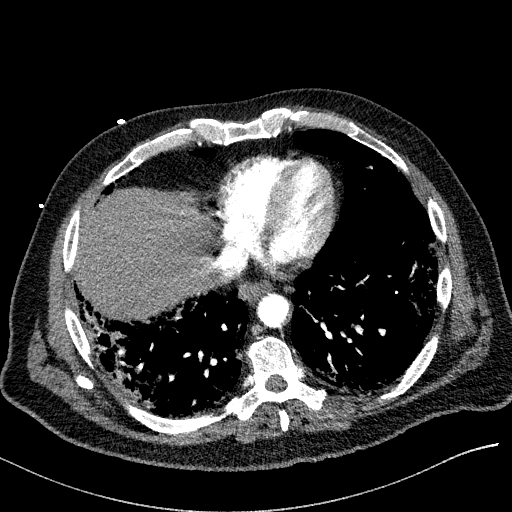
[im 51/138  lung]
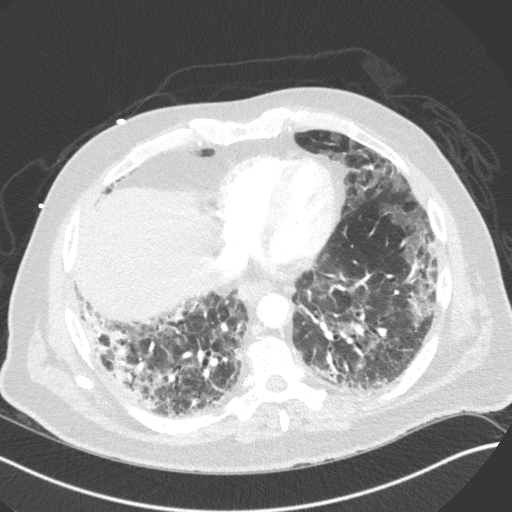
[im 61/138  lung]
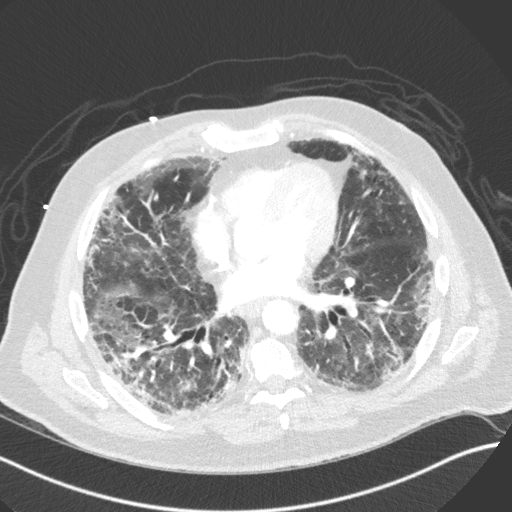
[im 77/138  lung]
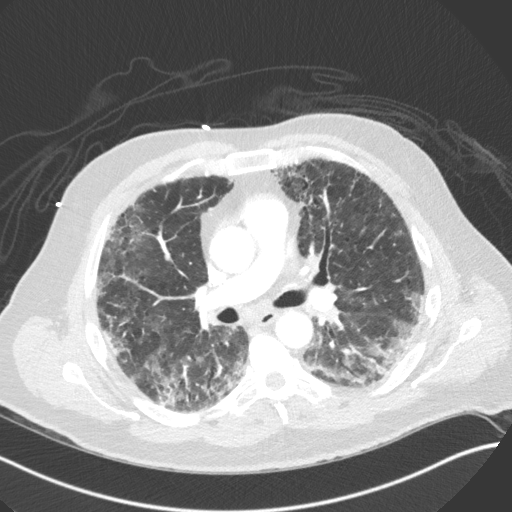
[im 87/138  lung]
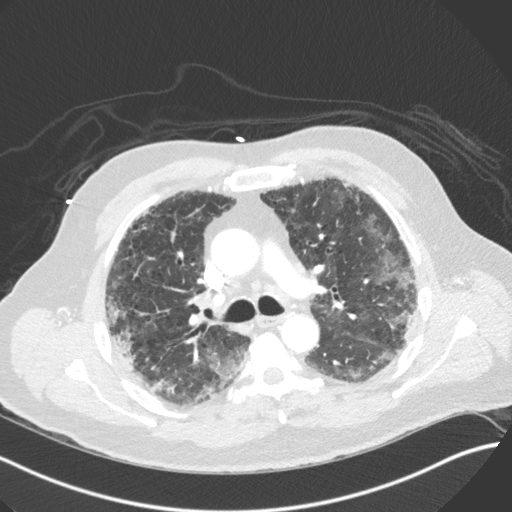
[im 97/138  mediastinal]
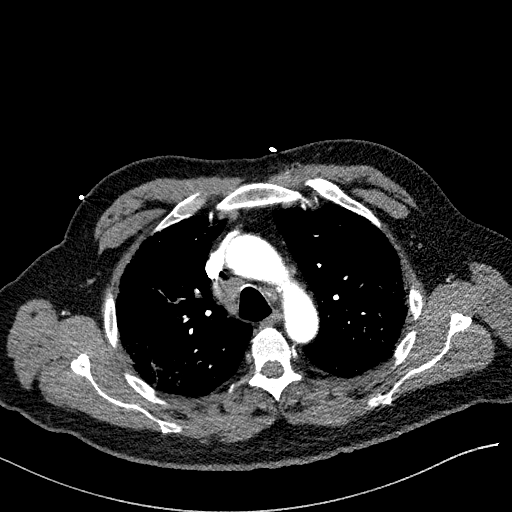
[im 97/138  lung]
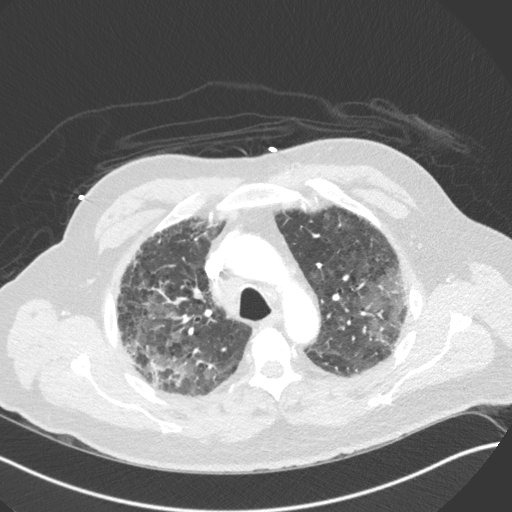
[im 107/138  lung]
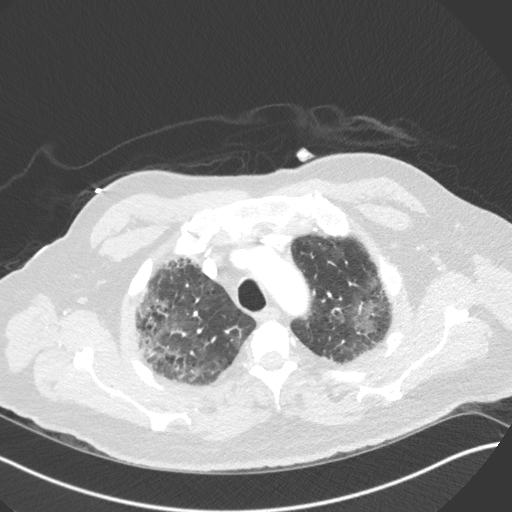
[im 117/138  lung]
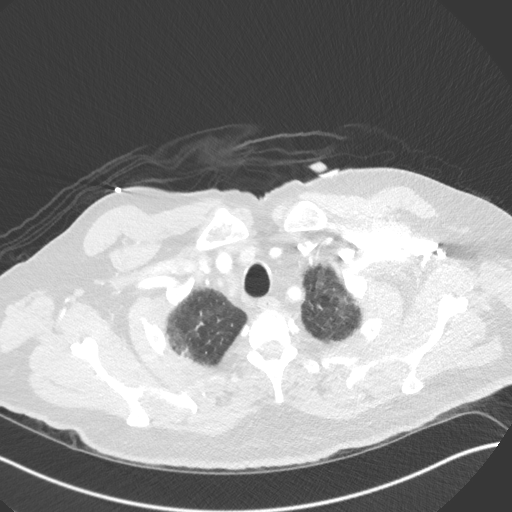
[im 127/138  lung]
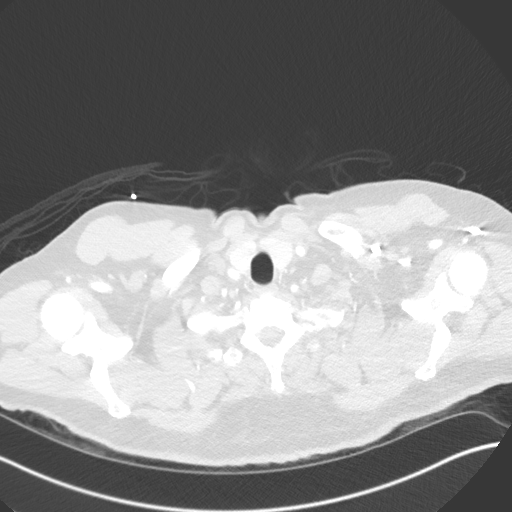

[Series 5: coronal · coronal · 0.57mm/px · 3 of 150 slices shown]
[im 30/150  lung]
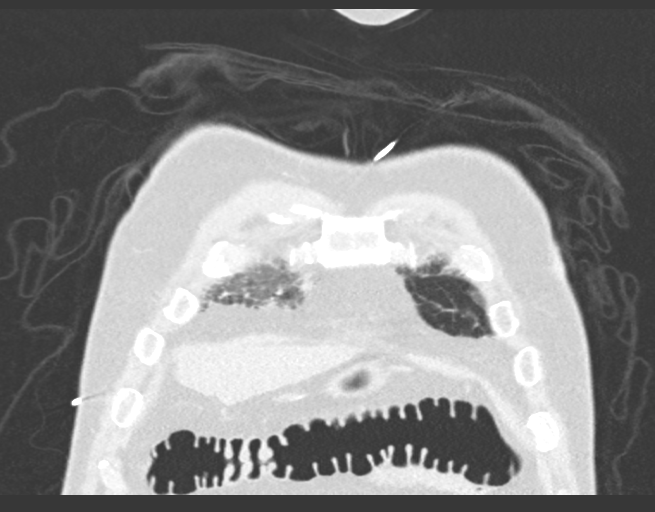
[im 60/150  lung]
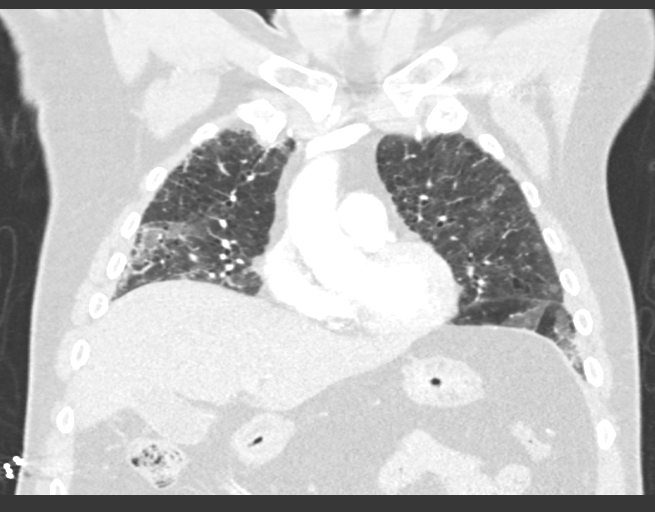
[im 90/150  lung]
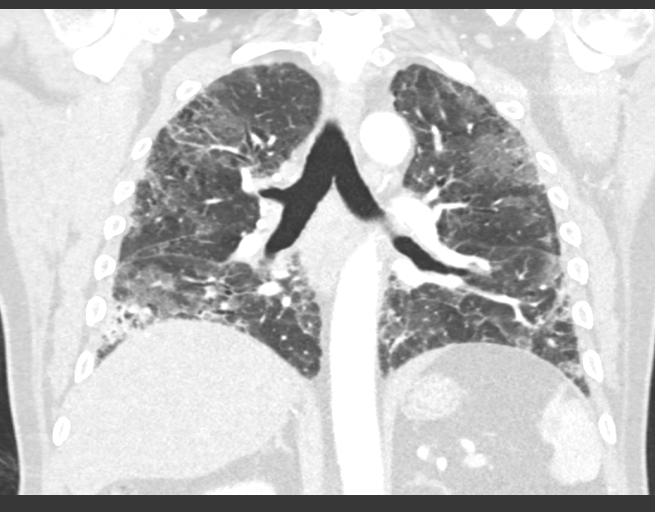

[15 of 36 positions shown; findings below may reference images not displayed]

FINDINGS: Cardiovascular: Heart size normal. No pericardial effusion. Coronary
calcifications. Aortic Atherosclerosis (UMUZH-170.0).

Mediastinum/Nodes: Enlarged AP window and right paratracheal nodes
up to 1.5 cm short axis diameter. No definite hilar adenopathy.

Lungs/Pleura: No pleural effusion. No pneumothorax. Bronchiectasis
posteriorly in both lower lobes. Coarse subpleural interstitial
thickening in both lungs. Patchy ground-glass opacities scattered
throughout most lungs predominantly in the periphery.

Upper Abdomen: No acute findings

Musculoskeletal: Lower cervical spondylitic changes. No fracture or
worrisome bone lesion.
IMPRESSION: 1. Patchy ground-glass opacities in the periphery of both lungs,
nonspecific. Differential diagnosis includes atypical pneumonia,
hypersensitivity pneumonitis, and alveolar hemorrhage.
2. Mediastinal adenopathy, possibly reactive but nonspecific.
3. Coronary and Aortic Atherosclerosis (UMUZH-170.0).
4. Coarse subpleural interstitial thickening in both lungs
suggesting interstitial lung disease, with bibasilar bronchiectasis.

## 2020-08-26 IMAGING — DX DG CHEST 1V PORT
1 series · 1 of 1 positions shown · non-contrast
Comparison: December 29, 2019

CLINICAL DATA: Shortness of breath, COVID

EXAM:
PORTABLE CHEST 1 VIEW

[chest ap]
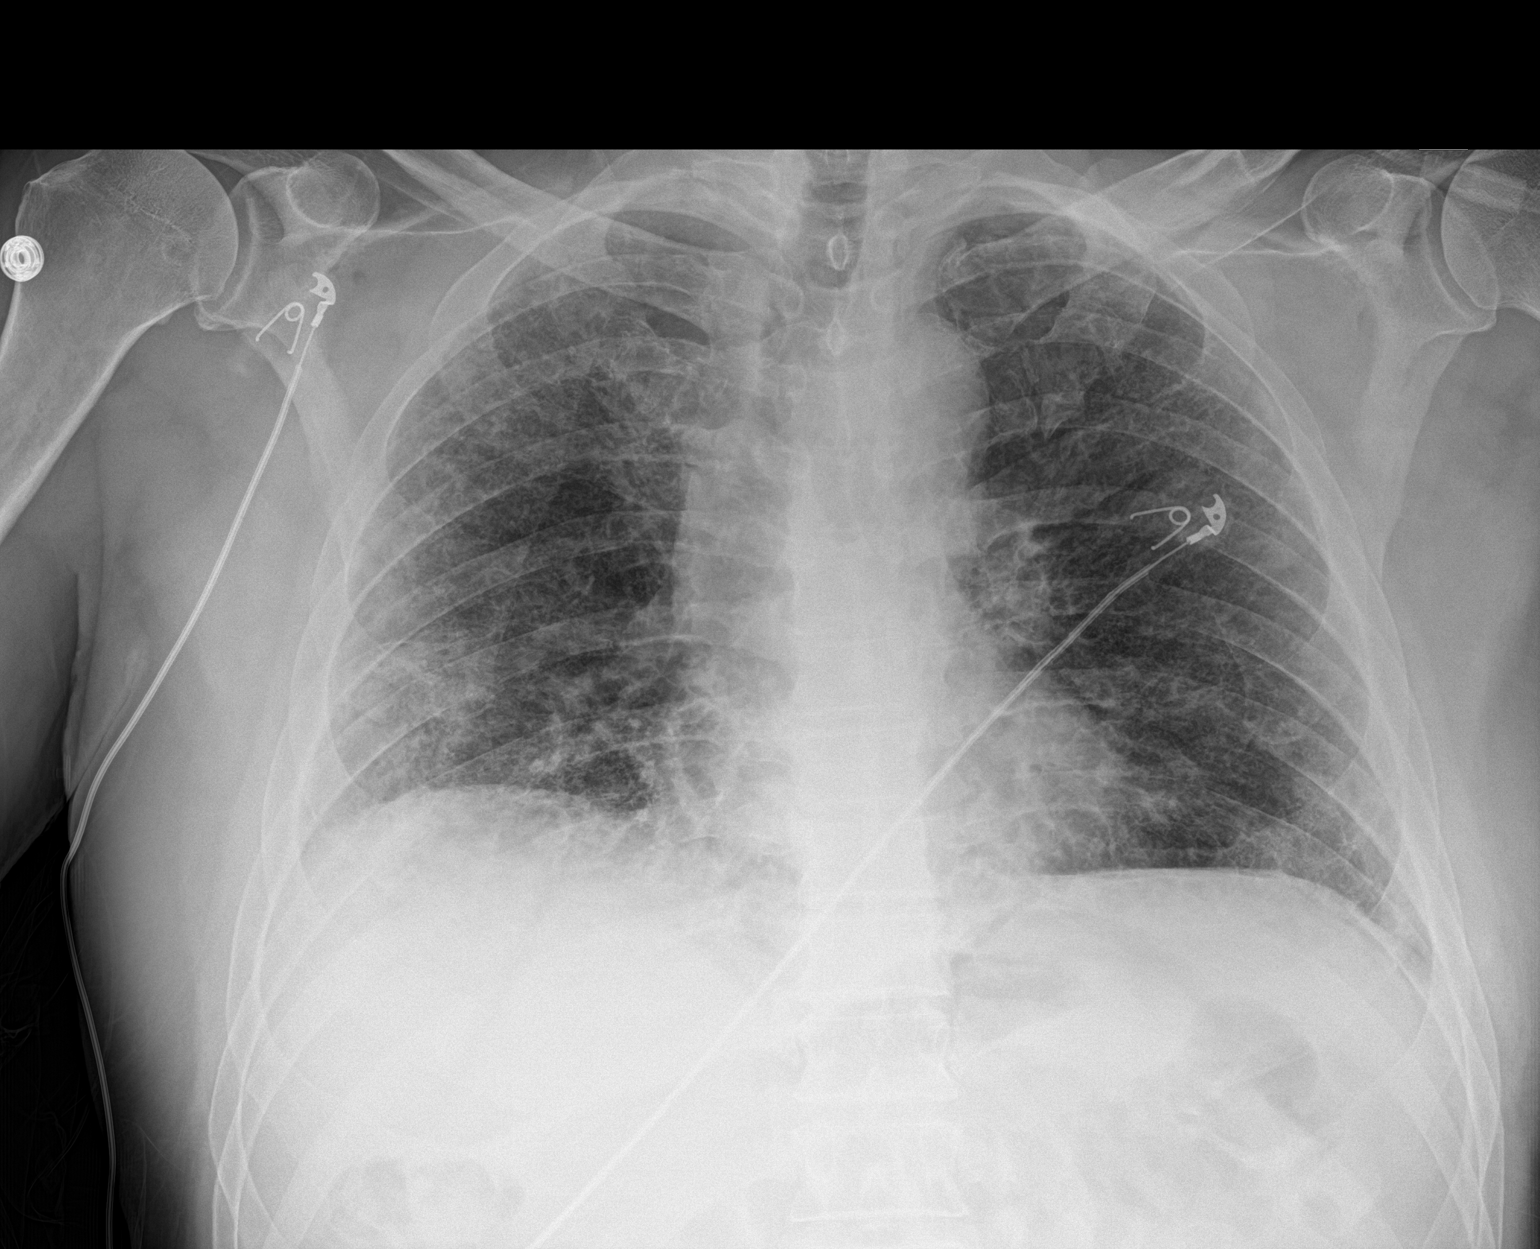

[1 of 1 positions shown; findings below may reference images not displayed]

FINDINGS: The mediastinal contour and cardiac silhouette are normal. Increased
pulmonary interstitium and hazy ground-glass opacities are
identified throughout both lungs unchanged. There is no pleural
effusion. No acute osseous abnormality is identified.
IMPRESSION: Increased pulmonary interstitium and hazy ground-glass opacities
throughout both lungs unchanged compared to prior exam.

## 2021-11-04 DEATH — deceased
